# Patient Record
Sex: Female | Born: 1985 | Race: Black or African American | Hispanic: No | Marital: Single | Smoking: Current every day smoker
Health system: Southern US, Community
[De-identification: ages and names within clinical notes are randomized; demographics above are authoritative.]

## PROBLEM LIST (undated history)

## (undated) ENCOUNTER — Inpatient Hospital Stay (HOSPITAL_COMMUNITY): Payer: Self-pay

## (undated) DIAGNOSIS — B9689 Other specified bacterial agents as the cause of diseases classified elsewhere: Secondary | ICD-10-CM

## (undated) DIAGNOSIS — IMO0002 Reserved for concepts with insufficient information to code with codable children: Secondary | ICD-10-CM

## (undated) DIAGNOSIS — R87619 Unspecified abnormal cytological findings in specimens from cervix uteri: Secondary | ICD-10-CM

## (undated) DIAGNOSIS — N760 Acute vaginitis: Principal | ICD-10-CM

## (undated) DIAGNOSIS — A749 Chlamydial infection, unspecified: Secondary | ICD-10-CM

## (undated) DIAGNOSIS — A599 Trichomoniasis, unspecified: Secondary | ICD-10-CM

## (undated) HISTORY — PX: DILATION AND CURETTAGE OF UTERUS: SHX78

---
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
## 2006-01-09 ENCOUNTER — Inpatient Hospital Stay (HOSPITAL_COMMUNITY): Admission: AD | Admit: 2006-01-09 | Discharge: 2006-01-11 | Payer: Self-pay | Admitting: Family Medicine

## 2006-01-09 ENCOUNTER — Ambulatory Visit: Payer: Self-pay | Admitting: Family Medicine

## 2008-03-03 ENCOUNTER — Ambulatory Visit (HOSPITAL_COMMUNITY): Admission: RE | Admit: 2008-03-03 | Discharge: 2008-03-03 | Payer: Self-pay | Admitting: Obstetrics & Gynecology

## 2008-05-11 ENCOUNTER — Inpatient Hospital Stay (HOSPITAL_COMMUNITY): Admission: AD | Admit: 2008-05-11 | Discharge: 2008-05-13 | Payer: Self-pay | Admitting: Obstetrics & Gynecology

## 2008-05-11 ENCOUNTER — Ambulatory Visit: Payer: Self-pay | Admitting: Obstetrics and Gynecology

## 2009-08-02 ENCOUNTER — Emergency Department (HOSPITAL_COMMUNITY): Admission: EM | Admit: 2009-08-02 | Discharge: 2009-08-02 | Payer: Self-pay | Admitting: Family Medicine

## 2011-02-09 ENCOUNTER — Inpatient Hospital Stay (INDEPENDENT_AMBULATORY_CARE_PROVIDER_SITE_OTHER)
Admission: RE | Admit: 2011-02-09 | Discharge: 2011-02-09 | Disposition: A | Discharge status: Home / Self Care | Payer: Medicaid Other | Source: Ambulatory Visit | Attending: Emergency Medicine | Admitting: Emergency Medicine

## 2011-02-09 DIAGNOSIS — N760 Acute vaginitis: Secondary | ICD-10-CM

## 2011-02-09 LAB — POCT PREGNANCY, URINE: Preg Test, Ur: NEGATIVE

## 2011-02-09 LAB — POCT URINALYSIS DIPSTICK
Hgb urine dipstick: NEGATIVE
Nitrite: NEGATIVE
Protein, ur: NEGATIVE mg/dL
Urine Glucose, Fasting: NEGATIVE mg/dL
pH: 5.5 (ref 5.0–8.0)

## 2011-02-09 LAB — WET PREP, GENITAL

## 2011-02-10 LAB — GC/CHLAMYDIA PROBE AMP, GENITAL
Chlamydia, DNA Probe: NEGATIVE
GC Probe Amp, Genital: NEGATIVE

## 2011-03-19 LAB — HERPES SIMPLEX VIRUS CULTURE: Culture: DETECTED

## 2011-03-19 LAB — POCT URINALYSIS DIP (DEVICE)
Glucose, UA: NEGATIVE mg/dL
Hgb urine dipstick: NEGATIVE
Ketones, ur: 15 mg/dL — AB
Nitrite: NEGATIVE
Protein, ur: 30 mg/dL — AB
Specific Gravity, Urine: 1.025 (ref 1.005–1.030)
Urobilinogen, UA: 1 mg/dL (ref 0.0–1.0)
pH: 6 (ref 5.0–8.0)

## 2011-03-19 LAB — GC/CHLAMYDIA PROBE AMP, GENITAL
Chlamydia, DNA Probe: NEGATIVE
GC Probe Amp, Genital: NEGATIVE

## 2011-03-19 LAB — WET PREP, GENITAL
Trich, Wet Prep: NONE SEEN
Yeast Wet Prep HPF POC: NONE SEEN

## 2011-03-19 LAB — POCT PREGNANCY, URINE: Preg Test, Ur: NEGATIVE

## 2011-05-18 ENCOUNTER — Inpatient Hospital Stay (INDEPENDENT_AMBULATORY_CARE_PROVIDER_SITE_OTHER)
Admission: RE | Admit: 2011-05-18 | Discharge: 2011-05-18 | Disposition: A | Discharge status: Home / Self Care | Payer: Medicaid Other | Source: Ambulatory Visit | Attending: Emergency Medicine | Admitting: Emergency Medicine

## 2011-05-18 DIAGNOSIS — R6889 Other general symptoms and signs: Secondary | ICD-10-CM

## 2011-05-18 DIAGNOSIS — A6000 Herpesviral infection of urogenital system, unspecified: Secondary | ICD-10-CM

## 2011-05-18 LAB — WET PREP, GENITAL
Clue Cells Wet Prep HPF POC: NONE SEEN
Trich, Wet Prep: NONE SEEN
Yeast Wet Prep HPF POC: NONE SEEN

## 2011-05-19 LAB — GC/CHLAMYDIA PROBE AMP, GENITAL
Chlamydia, DNA Probe: NEGATIVE
GC Probe Amp, Genital: NEGATIVE

## 2011-09-07 LAB — CBC
HCT: 36.5
Hemoglobin: 12.6
MCHC: 34.5
MCV: 99.7
Platelets: 354
RBC: 3.66 — ABNORMAL LOW
RDW: 12.7
WBC: 7.4

## 2011-09-07 LAB — RPR: RPR Ser Ql: NONREACTIVE

## 2011-09-07 LAB — RAPID HIV SCREEN (WH-MAU): Rapid HIV Screen: NONREACTIVE

## 2011-11-01 ENCOUNTER — Emergency Department (HOSPITAL_COMMUNITY)
Admission: EM | Admit: 2011-11-01 | Discharge: 2011-11-01 | Disposition: A | Discharge status: Home / Self Care | Payer: Medicaid Other | Source: Home / Self Care | Attending: Family Medicine | Admitting: Family Medicine

## 2011-11-01 ENCOUNTER — Encounter: Payer: Self-pay | Admitting: *Deleted

## 2011-11-01 DIAGNOSIS — A499 Bacterial infection, unspecified: Secondary | ICD-10-CM

## 2011-11-01 DIAGNOSIS — N760 Acute vaginitis: Secondary | ICD-10-CM

## 2011-11-01 HISTORY — DX: Other specified bacterial agents as the cause of diseases classified elsewhere: B96.89

## 2011-11-01 HISTORY — DX: Other specified bacterial agents as the cause of diseases classified elsewhere: N76.0

## 2011-11-01 LAB — WET PREP, GENITAL

## 2011-11-01 LAB — POCT URINALYSIS DIP (DEVICE)
Bilirubin Urine: NEGATIVE
Glucose, UA: NEGATIVE mg/dL
Ketones, ur: NEGATIVE mg/dL
Leukocytes, UA: NEGATIVE
Nitrite: NEGATIVE
Protein, ur: NEGATIVE mg/dL
Specific Gravity, Urine: 1.025 (ref 1.005–1.030)
Urobilinogen, UA: 1 mg/dL (ref 0.0–1.0)
pH: 6 (ref 5.0–8.0)

## 2011-11-01 LAB — POCT PREGNANCY, URINE: Preg Test, Ur: NEGATIVE

## 2011-11-01 MED ORDER — METRONIDAZOLE 250 MG PO TABS: 250.0000 mg | ORAL_TABLET | Freq: Three times a day (TID) | ORAL | Status: AC

## 2011-11-01 NOTE — ED Notes (Signed)
Pt c/o milky white discharge onset approx 5-6 day ago.  States that she feels "uncomfortable" in her vaginal area.  Hx of genital herpes, but has had only 1 outbreak and doesn't know what to look for as far as symptoms.  Denies urinary sx.  Denies low abd pain.  Had unprotected sex 2 mos ago.

## 2011-11-01 NOTE — ED Provider Notes (Addendum)
History     CSN: [id number] Arrival date & time: 11/01/2011 10:17 AM   First MD Initiated Contact with Patient 11/01/11 1004      Chief Complaint  Patient presents with  . Vaginal Discharge    (Consider location/radiation/quality/duration/timing/severity/associated sxs/prior treatment) Patient is a 25 y.o. female presenting with vaginal discharge.  Vaginal Discharge This is a new problem. The current episode started more than 2 days ago. The problem occurs constantly. The problem has not changed since onset.Pertinent negatives include no abdominal pain.    Past Medical History  Diagnosis Date  . Genital herpes   . Bacterial vaginosis   . Ectopic pregnancy   . Abnormal Pap smear   . Chlamydia   . Trichomonas     Past Surgical History  Procedure Date  . Dilation and curettage of uterus   . Laparoscopy 01/29/2012    Procedure: LAPAROSCOPY OPERATIVE;  Surgeon: Hollie Salk C. Marice Potter, MD;  Location: [facility];  Service: Gynecology;  Laterality: N/A;  operative laparoscopy. Right salpingectomy with removal of ectopic pregnancy    Family History  Problem Relation Age of Onset  . Hypertension Maternal Grandmother   . Diabetes Maternal Grandmother   . Anesthesia problems Neg Hx   . Migraines Sister     History  Substance Use Topics  . Smoking status: Current Everyday Smoker -- 0.5 packs/day for 5 years    Types: Cigarettes  . Smokeless tobacco: Never Used  . Alcohol Use: No    OB History    Grav Para Term Preterm Abortions TAB SAB Ect Mult Living   5 3 3  0 2 1 0 1 0 3      Review of Systems  Constitutional: Negative.   Gastrointestinal: Negative for abdominal pain.  Genitourinary: Positive for vaginal discharge. Negative for vaginal bleeding, difficulty urinating and vaginal pain.    Allergies  Review of patient's allergies indicates no known allergies.  Home Medications   Current Outpatient Rx  Name Route Sig Dispense Refill  . ETONOGESTREL-ETHINYL ESTRADIOL  0.12-0.015 MG/24HR VA RING  Insert vaginally and leave in place for 3 consecutive weeks, then remove for 1 week. 1 each 12  . OXYCODONE-ACETAMINOPHEN 7.5-325 MG PO TABS Oral Take 1 tablet by mouth every 4 (four) hours as needed. For pain      BP 125/87  Pulse 70  Temp(Src) 98.8 F (37.1 C) (Oral)  Resp 12  SpO2 100%  LMP 10/02/2011  Physical Exam  Nursing note and vitals reviewed. Constitutional: She appears well-developed and well-nourished.  Abdominal: Soft. Bowel sounds are normal. She exhibits no distension and no mass. There is no tenderness. There is no rebound and no guarding.  Genitourinary: Uterus normal. Pelvic exam was performed with patient supine. There is no rash or tenderness on the right labia. There is no rash or tenderness on the left labia. Cervix exhibits no motion tenderness, no discharge and no friability. No erythema, tenderness or bleeding around the vagina. Vaginal discharge found.    ED Course  Procedures (including critical care time)  Labs Reviewed  POCT URINALYSIS DIP (DEVICE) - Abnormal; Notable for the following:    Hgb urine dipstick TRACE (*)    All other components within normal limits  WET PREP, GENITAL - Abnormal; Notable for the following:    Yeast Wet Prep HPF POC FEW (*)    WBC, Wet Prep HPF POC FEW (*)    All other components within normal limits  POCT PREGNANCY, URINE  GC/CHLAMYDIA PROBE AMP, GENITAL  LAB REPORT - SCANNED   No results found.   1. Bacterial vaginosis       MDM          Barkley Bruns, MD 11/01/11 1055  Linna Hoff, MD 04/07/12 1007

## 2011-11-02 ENCOUNTER — Telehealth (HOSPITAL_COMMUNITY): Payer: Self-pay | Admitting: *Deleted

## 2011-11-02 LAB — GC/CHLAMYDIA PROBE AMP, GENITAL
Chlamydia, DNA Probe: NEGATIVE
GC Probe Amp, Genital: NEGATIVE

## 2011-11-04 ENCOUNTER — Telehealth (HOSPITAL_COMMUNITY): Payer: Self-pay | Admitting: *Deleted

## 2011-11-04 NOTE — ED Notes (Signed)
Pt notified of need for treatment.  Rx for Diflucan 150 mg 1 po x 1 today and may repeat in 2 days if needed (#2) called into [facility] on [address] per request of pt.  Rx ordered by Dr. Ladon Applebaum on 11/02/11

## 2011-12-01 ENCOUNTER — Encounter (HOSPITAL_COMMUNITY): Payer: Self-pay | Admitting: *Deleted

## 2011-12-01 ENCOUNTER — Inpatient Hospital Stay (HOSPITAL_COMMUNITY)
Admission: AD | Admit: 2011-12-01 | Discharge: 2011-12-01 | Disposition: A | Discharge status: Home / Self Care | Payer: Medicaid Other | Source: Ambulatory Visit | Attending: Obstetrics & Gynecology | Admitting: Obstetrics & Gynecology

## 2011-12-01 DIAGNOSIS — N899 Noninflammatory disorder of vagina, unspecified: Secondary | ICD-10-CM | POA: Insufficient documentation

## 2011-12-01 DIAGNOSIS — N760 Acute vaginitis: Secondary | ICD-10-CM

## 2011-12-01 LAB — POCT PREGNANCY, URINE: Preg Test, Ur: NEGATIVE

## 2011-12-01 LAB — WET PREP, GENITAL: Clue Cells Wet Prep HPF POC: NONE SEEN

## 2011-12-01 MED ORDER — FLUCONAZOLE 150 MG PO TABS: 150.0000 mg | ORAL_TABLET | Freq: Once | ORAL | Status: AC

## 2011-12-01 NOTE — ED Provider Notes (Signed)
History   Pt presents today c/o vag irritation for the past 1wk. She states the itching has been both internal and external. She denies abd pain or fever. She states her menses began this morning.  Chief Complaint  Patient presents with  . Vaginal Discharge   HPI  OB History    Grav Para Term Preterm Abortions TAB SAB Ect Mult Living   4 3 3  0 1 1 0 0 0 3      Past Medical History  Diagnosis Date  . Genital herpes   . Bacterial vaginosis     Past Surgical History  Procedure Date  . Dilation and curettage of uterus     Family History  Problem Relation Age of Onset  . Hypertension Maternal Grandmother   . Diabetes Maternal Grandmother     History  Substance Use Topics  . Smoking status: Current Everyday Smoker -- 1.0 packs/day  . Smokeless tobacco: Not on file  . Alcohol Use: No    Allergies: No Known Allergies  No prescriptions prior to admission    Review of Systems  Constitutional: Negative for fever.  Eyes: Negative for blurred vision.  Cardiovascular: Negative for chest pain.  Gastrointestinal: Negative for nausea, vomiting, abdominal pain, diarrhea and constipation.  Genitourinary: Negative for dysuria, urgency, frequency and hematuria.  Neurological: Negative for dizziness and headaches.  Psychiatric/Behavioral: Negative for depression and suicidal ideas.   Physical Exam   Blood pressure 117/72, pulse 90, temperature 98.8 F (37.1 C), temperature source Oral, resp. rate 20, height 5\' 5"  (1.651 m), weight 194 lb 6.4 oz (88.179 kg), last menstrual period 12/01/2011, SpO2 99.00%.  Physical Exam  Nursing note and vitals reviewed. Constitutional: She is oriented to person, place, and time. She appears well-developed and well-nourished. No distress.  HENT:  Head: Normocephalic and atraumatic.  Eyes: EOM are normal. Pupils are equal, round, and reactive to light.  GI: Soft. She exhibits no distension. There is no tenderness. There is no rebound and no  guarding.  Genitourinary: There is no rash, tenderness, lesion or injury on the right labia. There is no rash, tenderness, lesion or injury on the left labia. There is bleeding around the vagina. Vaginal discharge found.       Moderate amount of bleeding noted on exam.  Neurological: She is alert and oriented to person, place, and time.  Skin: Skin is warm and dry. She is not diaphoretic.  Psychiatric: She has a normal mood and affect. Her behavior is normal. Judgment and thought content normal.    MAU Course  Procedures  Wet prep and GC/Chlamydia cultures done.  Results for orders placed during the hospital encounter of 12/01/11 (from the past 24 hour(s))  POCT PREGNANCY, URINE     Status: Normal   Collection Time   12/01/11  9:42 AM      Component Value Range   Preg Test, Ur NEGATIVE    WET PREP, GENITAL     Status: Abnormal   Collection Time   12/01/11 10:07 AM      Component Value Range   Yeast, Wet Prep NONE SEEN  NONE SEEN    Trich, Wet Prep NONE SEEN  NONE SEEN    Clue Cells, Wet Prep NONE SEEN  NONE SEEN    WBC, Wet Prep HPF POC FEW (*) NONE SEEN      Assessment and Plan  Vag irritation: will prophylactically tx with diflucan. Discussed diet, activity, risks, and precautions. Will await cultures.  Clinton Gallant. Rice  III, DrHSc, MPAS, PA-C  12/01/2011, 10:12 AM   Henrietta Hoover, PA 12/01/11 1040

## 2011-12-01 NOTE — Progress Notes (Signed)
Pt in c/o vaginal itching internal and external x1 week.  Denies any pain or foul odor.  Reports a milky, thick discharge.  Menstrual cycle started today.

## 2011-12-01 NOTE — Progress Notes (Signed)
Vaginal itching started a wk ago.

## 2011-12-02 LAB — GC/CHLAMYDIA PROBE AMP, GENITAL: Chlamydia, DNA Probe: NEGATIVE

## 2011-12-06 NOTE — ED Provider Notes (Signed)
Agree with above note.  Jodi Estrada H. 12/06/2011 2:12 AM

## 2012-01-29 ENCOUNTER — Inpatient Hospital Stay (HOSPITAL_COMMUNITY): Payer: Medicaid Other

## 2012-01-29 ENCOUNTER — Encounter (HOSPITAL_COMMUNITY): Payer: Self-pay | Admitting: Anesthesiology

## 2012-01-29 ENCOUNTER — Other Ambulatory Visit: Payer: Self-pay | Admitting: Obstetrics & Gynecology

## 2012-01-29 ENCOUNTER — Encounter (HOSPITAL_COMMUNITY)
Admission: AD | Disposition: A | Discharge status: Home / Self Care | Payer: Self-pay | Source: Ambulatory Visit | Attending: Obstetrics & Gynecology

## 2012-01-29 ENCOUNTER — Encounter (HOSPITAL_COMMUNITY): Payer: Self-pay | Admitting: *Deleted

## 2012-01-29 ENCOUNTER — Inpatient Hospital Stay (HOSPITAL_COMMUNITY): Payer: Medicaid Other | Admitting: Anesthesiology

## 2012-01-29 ENCOUNTER — Ambulatory Visit (HOSPITAL_COMMUNITY)
Admission: AD | Admit: 2012-01-29 | Discharge: 2012-01-29 | Disposition: A | Discharge status: Home / Self Care | Payer: Medicaid Other | Source: Ambulatory Visit | Attending: Obstetrics & Gynecology | Admitting: Obstetrics & Gynecology

## 2012-01-29 DIAGNOSIS — O00109 Unspecified tubal pregnancy without intrauterine pregnancy: Principal | ICD-10-CM | POA: Insufficient documentation

## 2012-01-29 HISTORY — PX: LAPAROSCOPY: SHX197

## 2012-01-29 LAB — URINALYSIS, ROUTINE W REFLEX MICROSCOPIC
Bilirubin Urine: NEGATIVE
Hgb urine dipstick: NEGATIVE
Ketones, ur: NEGATIVE mg/dL
Nitrite: NEGATIVE
pH: 6.5 (ref 5.0–8.0)

## 2012-01-29 LAB — CBC
HCT: 39 % (ref 36.0–46.0)
Hemoglobin: 13.2 g/dL (ref 12.0–15.0)
MCHC: 33.8 g/dL (ref 30.0–36.0)
RBC: 4.04 MIL/uL (ref 3.87–5.11)

## 2012-01-29 LAB — HCG, QUANTITATIVE, PREGNANCY: hCG, Beta Chain, Quant, S: 17799 m[IU]/mL — ABNORMAL HIGH (ref <5)

## 2012-01-29 SURGERY — LAPAROSCOPY OPERATIVE
Anesthesia: Epidural | Site: Abdomen | Wound class: Clean Contaminated

## 2012-01-29 MED ORDER — KETOROLAC TROMETHAMINE 30 MG/ML IJ SOLN
INTRAMUSCULAR | Status: DC | PRN
  Administered 2012-01-29: 30 mg via INTRAMUSCULAR

## 2012-01-29 MED ORDER — PHENYLEPHRINE 40 MCG/ML (10ML) SYRINGE FOR IV PUSH (FOR BLOOD PRESSURE SUPPORT)
PREFILLED_SYRINGE | INTRAVENOUS | Status: AC
  Filled 2012-01-29: qty 5

## 2012-01-29 MED ORDER — PHENYLEPHRINE HCL 10 MG/ML IJ SOLN
INTRAMUSCULAR | Status: DC | PRN
  Administered 2012-01-29: .08 mg via INTRAVENOUS
  Administered 2012-01-29: .12 mg via INTRAVENOUS

## 2012-01-29 MED ORDER — BUPIVACAINE HCL (PF) 0.5 % IJ SOLN
INTRAMUSCULAR | Status: AC
  Filled 2012-01-29: qty 30

## 2012-01-29 MED ORDER — LACTATED RINGERS IV SOLN
INTRAVENOUS | Status: DC | PRN
  Administered 2012-01-29 (×2): via INTRAVENOUS

## 2012-01-29 MED ORDER — FENTANYL CITRATE 0.05 MG/ML IJ SOLN
INTRAMUSCULAR | Status: AC
  Filled 2012-01-29: qty 5

## 2012-01-29 MED ORDER — MIDAZOLAM HCL 2 MG/2ML IJ SOLN
INTRAMUSCULAR | Status: AC
  Filled 2012-01-29: qty 2

## 2012-01-29 MED ORDER — LIDOCAINE HCL (CARDIAC) 20 MG/ML IV SOLN
INTRAVENOUS | Status: DC | PRN
  Administered 2012-01-29: 40 mg via INTRAVENOUS

## 2012-01-29 MED ORDER — CITRIC ACID-SODIUM CITRATE 334-500 MG/5ML PO SOLN
ORAL | Status: AC
  Administered 2012-01-29: 30 mL via ORAL
  Filled 2012-01-29: qty 15

## 2012-01-29 MED ORDER — KETOROLAC TROMETHAMINE 30 MG/ML IJ SOLN
INTRAMUSCULAR | Status: DC | PRN
  Administered 2012-01-29: 30 mg via INTRAVENOUS

## 2012-01-29 MED ORDER — NEOSTIGMINE METHYLSULFATE 1 MG/ML IJ SOLN
INTRAMUSCULAR | Status: AC
  Filled 2012-01-29: qty 10

## 2012-01-29 MED ORDER — BUPIVACAINE HCL (PF) 0.5 % IJ SOLN
INTRAMUSCULAR | Status: DC | PRN
  Administered 2012-01-29: 10 mL

## 2012-01-29 MED ORDER — ONDANSETRON HCL 4 MG/2ML IJ SOLN
INTRAMUSCULAR | Status: AC
  Filled 2012-01-29: qty 2

## 2012-01-29 MED ORDER — KETOROLAC TROMETHAMINE 30 MG/ML IJ SOLN
INTRAMUSCULAR | Status: AC
  Filled 2012-01-29: qty 2

## 2012-01-29 MED ORDER — NEOSTIGMINE METHYLSULFATE 1 MG/ML IJ SOLN
INTRAMUSCULAR | Status: DC | PRN
  Administered 2012-01-29: 3 mg via INTRAVENOUS

## 2012-01-29 MED ORDER — MIDAZOLAM HCL 5 MG/5ML IJ SOLN
INTRAMUSCULAR | Status: DC | PRN
  Administered 2012-01-29: 2 mg via INTRAVENOUS

## 2012-01-29 MED ORDER — DEXAMETHASONE SODIUM PHOSPHATE 10 MG/ML IJ SOLN
INTRAMUSCULAR | Status: AC
  Filled 2012-01-29: qty 1

## 2012-01-29 MED ORDER — CEFAZOLIN SODIUM 1-5 GM-% IV SOLN
INTRAVENOUS | Status: DC | PRN
  Administered 2012-01-29: 1 g via INTRAVENOUS

## 2012-01-29 MED ORDER — FENTANYL CITRATE 0.05 MG/ML IJ SOLN
INTRAMUSCULAR | Status: DC | PRN
  Administered 2012-01-29: 50 ug via INTRAVENOUS
  Administered 2012-01-29 (×2): 100 ug via INTRAVENOUS
  Administered 2012-01-29: 50 ug via INTRAVENOUS
  Administered 2012-01-29: 150 ug via INTRAVENOUS
  Administered 2012-01-29: 50 ug via INTRAVENOUS

## 2012-01-29 MED ORDER — DEXTROSE IN LACTATED RINGERS 5 % IV SOLN
INTRAVENOUS | Status: DC
  Administered 2012-01-29: 16:00:00 via INTRAVENOUS
  Administered 2012-01-29 (×2): 125 mL/h via INTRAVENOUS

## 2012-01-29 MED ORDER — LIDOCAINE HCL (CARDIAC) 20 MG/ML IV SOLN
INTRAVENOUS | Status: AC
  Filled 2012-01-29: qty 5

## 2012-01-29 MED ORDER — GLYCOPYRROLATE 0.2 MG/ML IJ SOLN
INTRAMUSCULAR | Status: AC
  Filled 2012-01-29: qty 1

## 2012-01-29 MED ORDER — ROCURONIUM BROMIDE 50 MG/5ML IV SOLN
INTRAVENOUS | Status: AC
  Filled 2012-01-29: qty 1

## 2012-01-29 MED ORDER — CITRIC ACID-SODIUM CITRATE 334-500 MG/5ML PO SOLN
30.0000 mL | Freq: Once | ORAL | Status: AC
  Administered 2012-01-29: 30 mL via ORAL

## 2012-01-29 MED ORDER — CEFAZOLIN SODIUM 1-5 GM-% IV SOLN
1.0000 g | INTRAVENOUS | Status: AC
  Administered 2012-01-29: 1 g via INTRAVENOUS

## 2012-01-29 MED ORDER — ONDANSETRON HCL 4 MG/2ML IJ SOLN
INTRAMUSCULAR | Status: DC | PRN
  Administered 2012-01-29: 4 mg via INTRAVENOUS

## 2012-01-29 MED ORDER — LACTATED RINGERS IR SOLN
Status: DC | PRN
  Administered 2012-01-29: 3000 mL

## 2012-01-29 MED ORDER — PROPOFOL 10 MG/ML IV EMUL
INTRAVENOUS | Status: AC
  Filled 2012-01-29: qty 20

## 2012-01-29 MED ORDER — DEXAMETHASONE SODIUM PHOSPHATE 4 MG/ML IJ SOLN
INTRAMUSCULAR | Status: DC | PRN
  Administered 2012-01-29: 10 mg via INTRAVENOUS

## 2012-01-29 MED ORDER — SUCCINYLCHOLINE CHLORIDE 20 MG/ML IJ SOLN
INTRAMUSCULAR | Status: AC
  Filled 2012-01-29: qty 10

## 2012-01-29 MED ORDER — ROCURONIUM BROMIDE 100 MG/10ML IV SOLN
INTRAVENOUS | Status: DC | PRN
  Administered 2012-01-29: 2.5 mg via INTRAVENOUS
  Administered 2012-01-29: 30 mg via INTRAVENOUS

## 2012-01-29 MED ORDER — FENTANYL CITRATE 0.05 MG/ML IJ SOLN: 25.0000 ug | INTRAMUSCULAR | Status: DC | PRN

## 2012-01-29 MED ORDER — CEFAZOLIN SODIUM 1-5 GM-% IV SOLN
INTRAVENOUS | Status: AC
  Administered 2012-01-29: 1 g via INTRAVENOUS
  Filled 2012-01-29: qty 50

## 2012-01-29 MED ORDER — OXYCODONE-ACETAMINOPHEN 7.5-325 MG PO TABS: 1.0000 | ORAL_TABLET | ORAL | Status: DC | PRN

## 2012-01-29 MED ORDER — PROPOFOL 10 MG/ML IV EMUL
INTRAVENOUS | Status: DC | PRN
  Administered 2012-01-29: 160 mg via INTRAVENOUS

## 2012-01-29 MED ORDER — SUCCINYLCHOLINE CHLORIDE 20 MG/ML IJ SOLN
INTRAMUSCULAR | Status: DC | PRN
  Administered 2012-01-29: 120 mg via INTRAVENOUS

## 2012-01-29 MED ORDER — GLYCOPYRROLATE 0.2 MG/ML IJ SOLN
INTRAMUSCULAR | Status: DC | PRN
  Administered 2012-01-29: .6 mg via INTRAVENOUS

## 2012-01-29 SURGICAL SUPPLY — 34 items
APL SKNCLS STERI-STRIP NONHPOA (GAUZE/BANDAGES/DRESSINGS) ×1
APPLICATOR COTTON TIP 6IN STRL (MISCELLANEOUS) ×2 IMPLANT
BAG SPEC RTRVL LRG 6X4 10 (ENDOMECHANICALS)
BENZOIN TINCTURE PRP APPL 2/3 (GAUZE/BANDAGES/DRESSINGS) ×2 IMPLANT
CABLE HIGH FREQUENCY MONO STRZ (ELECTRODE) IMPLANT
CATH ROBINSON RED A/P 16FR (CATHETERS) ×1 IMPLANT
CHLORAPREP W/TINT 26ML (MISCELLANEOUS) ×2 IMPLANT
CLOTH BEACON ORANGE TIMEOUT ST (SAFETY) ×2 IMPLANT
DRSG COVADERM PLUS 2X2 (GAUZE/BANDAGES/DRESSINGS) ×1 IMPLANT
ELECT REM PT RETURN 9FT ADLT (ELECTROSURGICAL) ×2
ELECTRODE REM PT RTRN 9FT ADLT (ELECTROSURGICAL) IMPLANT
FORCEPS CUTTING 33CM 5MM (CUTTING FORCEPS) IMPLANT
FORCEPS CUTTING 45CM 5MM (CUTTING FORCEPS) IMPLANT
GLOVE BIO SURGEON STRL SZ 6.5 (GLOVE) ×4 IMPLANT
GOWN PREVENTION PLUS LG XLONG (DISPOSABLE) ×4 IMPLANT
NDL INSUFFLATION 14GA 120MM (NEEDLE) ×1 IMPLANT
NDL SAFETY ECLIPSE 18X1.5 (NEEDLE) ×1 IMPLANT
NEEDLE HYPO 18GX1.5 SHARP (NEEDLE) ×2
NEEDLE INSUFFLATION 14GA 120MM (NEEDLE) ×2 IMPLANT
NS IRRIG 1000ML POUR BTL (IV SOLUTION) ×1 IMPLANT
PACK LAPAROSCOPY BASIN (CUSTOM PROCEDURE TRAY) ×2 IMPLANT
POUCH SPECIMEN RETRIEVAL 10MM (ENDOMECHANICALS) IMPLANT
PROTECTOR NERVE ULNAR (MISCELLANEOUS) ×2 IMPLANT
SET IRRIG TUBING LAPAROSCOPIC (IRRIGATION / IRRIGATOR) ×1 IMPLANT
STERI STRIP BROWN 1/4X3 R155 (GAUZE/BANDAGES/DRESSINGS) ×1 IMPLANT
STRIP CLOSURE SKIN 1/4X4 (GAUZE/BANDAGES/DRESSINGS) ×2 IMPLANT
SUT VICRYL 0 ENDOLOOP (SUTURE) IMPLANT
SUT VICRYL 0 UR6 27IN ABS (SUTURE) ×3 IMPLANT
SUT VICRYL 4-0 PS2 18IN ABS (SUTURE) ×3 IMPLANT
TOWEL OR 17X24 6PK STRL BLUE (TOWEL DISPOSABLE) ×4 IMPLANT
TROCAR XCEL NON-BLD 11X100MML (ENDOMECHANICALS) ×2 IMPLANT
TROCAR XCEL NON-BLD 5MMX100MML (ENDOMECHANICALS) ×1 IMPLANT
WARMER LAPAROSCOPE (MISCELLANEOUS) ×2 IMPLANT
WATER STERILE IRR 1000ML POUR (IV SOLUTION) ×1 IMPLANT

## 2012-01-29 NOTE — Anesthesia Procedure Notes (Signed)
Procedure Name: Intubation Performed by: Sallye Lunz D Pre-anesthesia Checklist: Patient identified, Emergency Drugs available, Suction available, Timeout performed and Patient being monitored Patient Re-evaluated:Patient Re-evaluated prior to inductionOxygen Delivery Method: Circle System Utilized Preoxygenation: Pre-oxygenation with 100% oxygen Intubation Type: IV induction, Rapid sequence and Cricoid Pressure applied Laryngoscope Size: Mac and 3 Grade View: Grade I Tube type: Oral Tube size: 7.0 mm Number of attempts: 1 Airway Equipment and Method: stylet Placement Confirmation: ETT inserted through vocal cords under direct vision,  positive ETCO2 and breath sounds checked- equal and bilateral Secured at: 22 cm Tube secured with: Tape Dental Injury: Teeth and Oropharynx as per pre-operative assessment

## 2012-01-29 NOTE — ED Provider Notes (Signed)
History   Jodi Estrada is a 26 y.o. year old G51P3013 female at Unknown weeks gestation who presents to MAU reporting low abd pain 6/10. No BM since six days ago. Patient's last menstrual period was 12/02/2011. Took Plan B 12/13/11. Had spotting x 12 days since 01/05/12 w/out passage of tissue. Last ate at 2100 01/28/12.    CSN: [id number]  Arrival date & time 01/29/12  1313   None     Chief Complaint  Patient presents with  . Abdominal Pain  . Constipation    (Consider location/radiation/quality/duration/timing/severity/associated sxs/prior treatment) HPI  Past Medical History  Diagnosis Date  . Genital herpes   . Bacterial vaginosis     Past Surgical History  Procedure Date  . Dilation and curettage of uterus     Family History  Problem Relation Age of Onset  . Hypertension Maternal Grandmother   . Diabetes Maternal Grandmother     History  Substance Use Topics  . Smoking status: Current Everyday Smoker -- 1.0 packs/day  . Smokeless tobacco: Not on file  . Alcohol Use: No    OB History    Grav Para Term Preterm Abortions TAB SAB Ect Mult Living   4 3 3  0 1 1 0 0 0 3      Review of Systems  Constitutional: Negative for fever, chills and appetite change.  Gastrointestinal: Positive for abdominal pain, constipation and rectal pain. Negative for nausea, vomiting, diarrhea and abdominal distention.       Feels as if she has need to make herself vomit twice to ease the discomfort.  Genitourinary: Negative for dysuria, frequency, flank pain, vaginal bleeding and pelvic pain.  Neurological: Negative for weakness.    Allergies  Review of patient's allergies indicates no known allergies.  Home Medications  No current outpatient prescriptions on file.  BP 121/74  Pulse 96  Temp(Src) 97.7 F (36.5 C) (Oral)  Ht 5\' 7"  (1.702 m)  Wt 89.721 kg (197 lb 12.8 oz)  BMI 30.98 kg/m2  LMP 12/02/2011  Physical Exam  Constitutional: She appears well-developed and  well-nourished. She appears distressed (mildly).  HENT:  Head: Normocephalic.  Eyes: Pupils are equal, round, and reactive to light.  Cardiovascular: Normal rate.   Pulmonary/Chest: Effort normal.  Abdominal: She exhibits distension (mild). She exhibits no mass. There is tenderness (R>L). There is guarding. There is no rebound and no CVA tenderness.       Decreased BS    ED Course  Procedures (including critical care time) Results for orders placed during the hospital encounter of 01/29/12 (from the past 24 hour(s))  URINALYSIS, ROUTINE W REFLEX MICROSCOPIC     Status: Normal   Collection Time   01/29/12  1:25 PM      Component Value Range   Color, Urine YELLOW  YELLOW    APPearance CLEAR  CLEAR    Specific Gravity, Urine 1.010  1.005 - 1.030    pH 6.5  5.0 - 8.0    Glucose, UA NEGATIVE  NEGATIVE (mg/dL)   Hgb urine dipstick NEGATIVE  NEGATIVE    Bilirubin Urine NEGATIVE  NEGATIVE    Ketones, ur NEGATIVE  NEGATIVE (mg/dL)   Protein, ur NEGATIVE  NEGATIVE (mg/dL)   Urobilinogen, UA 0.2  0.0 - 1.0 (mg/dL)   Nitrite NEGATIVE  NEGATIVE    Leukocytes, UA NEGATIVE  NEGATIVE   POCT PREGNANCY, URINE     Status: Abnormal   Collection Time   01/29/12  1:37 PM  Component Value Range   Preg Test, Ur POSITIVE (*) NEGATIVE   ABO/RH     Status: Normal   Collection Time   01/29/12  1:53 PM      Component Value Range   ABO/RH(D) A POS    HCG, QUANTITATIVE, PREGNANCY     Status: Abnormal   Collection Time   01/29/12  1:53 PM      Component Value Range   hCG, Beta Chain, Quant, S 17799 (*) <5 (mIU/mL)  CBC     Status: Normal   Collection Time   01/29/12  2:04 PM      Component Value Range   WBC 5.6  4.0 - 10.5 (K/uL)   RBC 4.04  3.87 - 5.11 (MIL/uL)   Hemoglobin 13.2  12.0 - 15.0 (g/dL)   HCT 96.0  45.4 - 09.8 (%)   MCV 96.5  78.0 - 100.0 (fL)   MCH 32.7  26.0 - 34.0 (pg)   MCHC 33.8  30.0 - 36.0 (g/dL)   RDW 11.9  14.7 - 82.9 (%)   Platelets 290  150 - 400 (K/uL)   US shows  ruptured right ectopic. 8.2x2.8x2.0 cm heterogeneous clot.  MDM  Assessment: Ruptured right ectopic  Plan: Prep for OR per consult w/ Dr. Marice Potter.  Dorathy Kinsman 01/29/2012 3:23 PM

## 2012-01-29 NOTE — Anesthesia Preprocedure Evaluation (Addendum)
Anesthesia Evaluation  Patient identified by MRN, date of birth, ID band Patient awake    Reviewed: Allergy & Precautions, H&P , Patient's Chart, lab work & pertinent test results, reviewed documented beta blocker date and time   Airway Mallampati: II TM Distance: >3 FB Neck ROM: full    Dental No notable dental hx.    Pulmonary  clear to auscultation  Pulmonary exam normal       Cardiovascular regular Normal    Neuro/Psych    GI/Hepatic   Endo/Other    Renal/GU      Musculoskeletal   Abdominal   Peds  Hematology   Anesthesia Other Findings   Reproductive/Obstetrics                           Anesthesia Physical Anesthesia Plan  ASA: II and Emergent  Anesthesia Plan: Epidural   Post-op Pain Management:    Induction: Intravenous  Airway Management Planned: Oral ETT  Additional Equipment:   Intra-op Plan:   Post-operative Plan:   Informed Consent: I have reviewed the patients History and Physical, chart, labs and discussed the procedure including the risks, benefits and alternatives for the proposed anesthesia with the patient or authorized representative who has indicated his/her understanding and acceptance.   Dental Advisory Given and Dental advisory given  Plan Discussed with: CRNA and Surgeon  Anesthesia Plan Comments: (  Discussed  general anesthesia, including possible nausea, instrumentation of airway, sore throat,pulmonary aspiration, etc. I asked if the were any outstanding questions, or  concerns before we proceeded. )        Anesthesia Quick Evaluation

## 2012-01-29 NOTE — Op Note (Signed)
01/29/2012  5:12 PM  PATIENT:  Jodi Estrada  26 y.o. female  PRE-OPERATIVE DIAGNOSIS:  ruptured ectopic pregnancy  POST-OPERATIVE DIAGNOSIS:  ruptured ectopic pregnancy  PROCEDURE:  Procedure(s) (LRB): LAPAROSCOPY OPERATIVE (N/A) Right salpinjectomy  SURGEON:  Surgeon(s) and Role:    * Natally Ribera C. Marice Potter, MD - Primary  PHYSICIAN ASSISTANT:   ASSISTANTS: none   ANESTHESIA:   general  EBL:  Total I/O In: 800 [I.V.:800] Out: 10 [Blood:10]  BLOOD ADMINISTERED:none  DRAINS: none   LOCAL MEDICATIONS USED:  MARCAINE     SPECIMEN:  Source of Specimen:  right oviduct  DISPOSITION OF SPECIMEN:  PATHOLOGY  COUNTS:  YES  TOURNIQUET:  * No tourniquets in log *  DICTATION: .Dragon Dictation  PLAN OF CARE: Discharge to home after PACU  PATIENT DISPOSITION:  PACU - hemodynamically stable.   Delay start of Pharmacological VTE agent (>24hrs) due to surgical blood loss or risk of bleeding: not applicable  The risks, benefits, and alternatives of surgery were explained, understood, accepted. All questions were answered and she signed her consents. In the operating room she was given general anesthesia and placed in the dorsolithotomy position. A timeout was done. Her abdomen and vagina were prepped and draped in the usual sterile fashion. A bimanual exam revealed a normal size and shape uterus and no pelvic masses. A Foley catheter was placed and and it drained clear throughout this case. Hulka manipulator was placed on the cervix. Gloves were changed and attention was turned to her abdomen. Approximately 5 mL the 0.5% Marcaine was used to inject the umbilical area. I then made an incision and a 10 mm Excel trocar was placed. Laparoscopy confirmed correct placement. She was placed in Trendelenburg position. Her pelvis was inspected. There was a proximally 1 L of clotted blood in her pelvis. Her upper abdomen appeared normal. The right oviduct was particularly large and swollen and oozing  dark blood. Both ovaries appeared normal, as did her left oviduct. A 5 mm port was placed in the right lower quadrant under direct laparoscopic visualization after injecting approximately 5 mL of 0.5% Marcaine. A 10 mm port was placed in the left lower quadrant under direct laparoscopic visualization, taking care to avoid the inferior epigastric vessels. There is also  Marcaine injected prior to the incision. I used the gyrus to excise the ruptured right oviduct. Excellent hemostasis was noted. I the used a 10 mm suction irrigator to clean out all the old brought out of her pelvis. I took her out of Trendelenburg position so as to allow the blood from the upper abdomen to come down to the bottom of her pelvis. I used the Endopouch to remove the right oviduct. I then did a subcuticular closure at all 3 incision sites with a 3-0 Vicryl suture. Steri-Strips were placed. The Hulka manipulator was removed. She was extubated and taken to recovery in stable condition. She tolerated the procedure well.

## 2012-01-29 NOTE — Progress Notes (Signed)
Onset of sharp abdominal pains no BM since last Monday after taking a laxative, difficulty passing gas, LMP 12/02/11 regular period, took emergency contraceptive on 12/13/11 has had some spotting for about 12 days.

## 2012-01-29 NOTE — Anesthesia Postprocedure Evaluation (Signed)
Anesthesia Post Note  Patient: Jodi Estrada  Procedure(s) Performed: Procedure(s) (LRB): LAPAROSCOPY OPERATIVE (N/A)  Anesthesia type: GA  Patient location: PACU  Post pain: Pain level controlled  Post assessment: Post-op Vital signs reviewed  Last Vitals:  Filed Vitals:   01/29/12 1830  BP: 109/68  Pulse: 84  Temp: 36.3 C  Resp: 20    Post vital signs: Reviewed  Level of consciousness: sedated  Complications: No apparent anesthesia complications

## 2012-01-29 NOTE — ED Notes (Signed)
Dr. Marice Potter at bedside  Discussed u/s results and reason for surgery. Pt tearfull and scared about surgery . Talked with her for a while and she felt  better about surgery. Pt able to talk to family on the phone as well.

## 2012-01-29 NOTE — Transfer of Care (Signed)
Immediate Anesthesia Transfer of Care Note  Patient: Jodi Estrada  Procedure(s) Performed: Procedure(s) (LRB): LAPAROSCOPY OPERATIVE (N/A)  Patient Location: PACU  Anesthesia Type: General  Level of Consciousness: awake, alert  and oriented  Airway & Oxygen Therapy: Patient Spontanous Breathing and Patient connected to nasal cannula oxygen  Post-op Assessment: Report given to PACU RN and Post -op Vital signs reviewed and stable  Post vital signs: stable  Complications: No apparent anesthesia complications

## 2012-01-29 NOTE — H&P (Signed)
26 yo AA G5P3 A1 who came to the MAU with complaint of 6 days of abdominal pain, which she associated with constipation.  Upon arrival here, her [facility] is 17,000. Her HBG is 13.2. Her abdominal exam reveals signigicant tenderness. Ultrasound shows an 8 cm clot consistent with ruptured ectopic pregnancy. Of note, she took the Plan B 12-13-11 and had some spotting for the last 12 days.  I have notified the OR and anesthesia. I have personally explained the surgery and its risks and answered all questions.

## 2012-01-30 ENCOUNTER — Encounter (HOSPITAL_COMMUNITY): Payer: Self-pay | Admitting: Obstetrics & Gynecology

## 2012-02-09 ENCOUNTER — Inpatient Hospital Stay (HOSPITAL_COMMUNITY)
Admission: AD | Admit: 2012-02-09 | Discharge: 2012-02-09 | Disposition: A | Discharge status: Home / Self Care | Payer: Medicaid Other | Source: Ambulatory Visit | Attending: Obstetrics & Gynecology | Admitting: Obstetrics & Gynecology

## 2012-02-09 ENCOUNTER — Encounter (HOSPITAL_COMMUNITY): Payer: Self-pay

## 2012-02-09 DIAGNOSIS — B373 Candidiasis of vulva and vagina: Secondary | ICD-10-CM | POA: Insufficient documentation

## 2012-02-09 DIAGNOSIS — B379 Candidiasis, unspecified: Secondary | ICD-10-CM

## 2012-02-09 DIAGNOSIS — A5901 Trichomonal vulvovaginitis: Secondary | ICD-10-CM | POA: Insufficient documentation

## 2012-02-09 DIAGNOSIS — L293 Anogenital pruritus, unspecified: Secondary | ICD-10-CM | POA: Insufficient documentation

## 2012-02-09 DIAGNOSIS — B3731 Acute candidiasis of vulva and vagina: Secondary | ICD-10-CM | POA: Insufficient documentation

## 2012-02-09 HISTORY — DX: Unspecified abnormal cytological findings in specimens from cervix uteri: R87.619

## 2012-02-09 HISTORY — DX: Reserved for concepts with insufficient information to code with codable children: IMO0002

## 2012-02-09 HISTORY — DX: Chlamydial infection, unspecified: A74.9

## 2012-02-09 HISTORY — DX: Trichomoniasis, unspecified: A59.9

## 2012-02-09 LAB — WET PREP, GENITAL

## 2012-02-09 MED ORDER — METRONIDAZOLE 500 MG PO TABS: 500.0000 mg | ORAL_TABLET | Freq: Two times a day (BID) | ORAL | Status: AC

## 2012-02-09 MED ORDER — FLUCONAZOLE 150 MG PO TABS
150.0000 mg | ORAL_TABLET | Freq: Once | ORAL | Status: AC
  Administered 2012-02-09: 150 mg via ORAL
  Filled 2012-02-09: qty 1

## 2012-02-09 NOTE — Progress Notes (Signed)
Called and was told to come in.  Ruptured ecptopic on 02/17. Vag itching started yesterday.

## 2012-02-09 NOTE — ED Provider Notes (Signed)
History     CSN: [id number]  Arrival date & time 02/09/12  1215   None     Chief Complaint  Patient presents with  . Vaginal Itching    (Consider location/radiation/quality/duration/timing/severity/associated sxs/prior treatment) HPI Jodi Estrada is a 26 y.o. female who presents to [facility] for vaginal itching that started yesterday. Current sex partner x 2 years. Last pap 2010 and was abnormal and had follow up. Hx of HSV.  Had surgery 01/29/12 for ruptured ectopic but has done fine since then. The history was provided by the patient.  Past Medical History  Diagnosis Date  . Genital herpes   . Bacterial vaginosis   . Ectopic pregnancy   . Abnormal Pap smear   . Chlamydia   . Trichomonas     Past Surgical History  Procedure Date  . Dilation and curettage of uterus   . Laparoscopy 01/29/2012    Procedure: LAPAROSCOPY OPERATIVE;  Surgeon: Hollie Salk C. Marice Potter, MD;  Location: [facility];  Service: Gynecology;  Laterality: N/A;  operative laparoscopy. Right salpingectomy with removal of ectopic pregnancy    Family History  Problem Relation Age of Onset  . Hypertension Maternal Grandmother   . Diabetes Maternal Grandmother   . Anesthesia problems Neg Hx     History  Substance Use Topics  . Smoking status: Current Everyday Smoker -- 0.5 packs/day for 5 years    Types: Cigarettes  . Smokeless tobacco: Never Used  . Alcohol Use: No    OB History    Grav Para Term Preterm Abortions TAB SAB Ect Mult Living   5 3 3  0 2 1 0 1 0 3      Review of Systems  Constitutional: Negative for fever, chills, diaphoresis and fatigue.  HENT: Negative for ear pain, congestion, sore throat, facial swelling, neck pain, neck stiffness, dental problem and sinus pressure.   Eyes: Negative for photophobia, pain and discharge.  Respiratory: Negative for cough, chest tightness and wheezing.   Gastrointestinal: Negative for nausea, vomiting, abdominal pain, diarrhea, constipation and abdominal distention.    Genitourinary: Positive for vaginal bleeding and vaginal discharge. Negative for dysuria, frequency, flank pain and difficulty urinating.  Musculoskeletal: Negative for myalgias, back pain and gait problem.  Skin: Negative for color change and rash.  Neurological: Negative for dizziness, speech difficulty, weakness, light-headedness, numbness and headaches.  Psychiatric/Behavioral: Negative for confusion and agitation.    Allergies  Review of patient's allergies indicates no known allergies.  Home Medications  No current outpatient prescriptions on file.  BP 115/68  Pulse 77  Temp(Src) 98.1 F (36.7 C) (Oral)  Resp 18  Ht 5\' 6"  (1.676 m)  Wt 198 lb (89.812 kg)  BMI 31.96 kg/m2  SpO2 100%  LMP 12/02/2011  Breastfeeding? Unknown  Physical Exam  Nursing note and vitals reviewed. Constitutional: She is oriented to person, place, and time. She appears well-developed and well-nourished.  HENT:  Head: Normocephalic.  Eyes: EOM are normal.  Neck: Neck supple.  Cardiovascular: Normal rate.   Pulmonary/Chest: Effort normal.  Abdominal: Soft. There is no tenderness.  Genitourinary:       External genitalia without lesions. Frothy vaginal discharge, blood tinged. Cervix inflamed. Uterus without palpable enlargement.  Musculoskeletal: Normal range of motion.  Neurological: She is alert and oriented to person, place, and time. No cranial nerve deficit.  Skin: Skin is warm and dry.  Psychiatric: She has a normal mood and affect. Her behavior is normal. Judgment and thought content normal.   Results for  orders placed during the hospital encounter of 02/09/12 (from the past 24 hour(s))  WET PREP, GENITAL     Status: Abnormal   Collection Time   02/09/12  1:25 PM      Component Value Range   Yeast Wet Prep HPF POC FEW (*) NONE SEEN    Trich, Wet Prep FEW (*) NONE SEEN    Clue Cells Wet Prep HPF POC NONE SEEN  NONE SEEN    WBC, Wet Prep HPF POC MODERATE (*) NONE SEEN     Assessment: Monilia vaginitis   Trichomonas vaginitis  Plan:  Diflucan 150 mg po now   Flagyl 500 mg po bid x 7 days   Follow up in [facility] Clinic ED Course  Procedures   MDM          [facility], NP 02/09/12 [phone]

## 2012-02-09 NOTE — Discharge Instructions (Signed)
Candida Infection, Adult A candida infection (also called yeast, fungus and Monilia infection) is an overgrowth of yeast that can occur anywhere on the body. A yeast infection commonly occurs in warm, moist body areas. Usually, the infection remains localized but can spread to become a systemic infection. A yeast infection may be a sign of a more severe disease such as diabetes, leukemia, or AIDS. A yeast infection can occur in both men and women. In women, Candida vaginitis is a vaginal infection. It is one of the most common causes of vaginitis. Men usually do not have symptoms or know they have an infection until other problems develop. Men may find out they have a yeast infection because their sex partner has a yeast infection. Uncircumcised men are more likely to get a yeast infection than circumcised men. This is because the uncircumcised glans is not exposed to air and does not remain as dry as that of a circumcised glans. Older adults may develop yeast infections around dentures. CAUSES  Women  Antibiotics.   Steroid medication taken for a long time.   Being overweight (obese).   Diabetes.   Poor immune condition.   Certain serious medical conditions.   Immune suppressive medications for organ transplant patients.   Chemotherapy.   Pregnancy.   Menstration.   Stress and fatigue.   Intravenous drug use.   Oral contraceptives.   Wearing tight-fitting clothes in the crotch area.   Catching it from a sex partner who has a yeast infection.   Spermicide.   Intravenous, urinary, or other catheters.  Men  Catching it from a sex partner who has a yeast infection.   Having oral or anal sex with a person who has the infection.   Spermicide.   Diabetes.   Antibiotics.   Poor immune system.   Medications that suppress the immune system.   Intravenous drug use.   Intravenous, urinary, or other catheters.  SYMPTOMS  Women  Thick, white vaginal discharge.    Vaginal itching.   Redness and swelling in and around the vagina.   Irritation of the lips of the vagina and perineum.   Blisters on the vaginal lips and perineum.   Painful sexual intercourse.   Low blood sugar (hypoglycemia).   Painful urination.   Bladder infections.   Intestinal problems such as constipation, indigestion, bad breath, bloating, increase in gas, diarrhea, or loose stools.  Men  Men may develop intestinal problems such as constipation, indigestion, bad breath, bloating, increase in gas, diarrhea, or loose stools.   Dry, cracked skin on the penis with itching or discomfort.   Jock itch.   Dry, flaky skin.   Athlete's foot.   Hypoglycemia.  DIAGNOSIS  Women  A history and an exam are performed.   The discharge may be examined under a microscope.   A culture may be taken of the discharge.  Men  A history and an exam are performed.   Any discharge from the penis or areas of cracked skin will be looked at under the microscope and cultured.   Stool samples may be cultured.  TREATMENT  Women  Vaginal antifungal suppositories and creams.   Medicated creams to decrease irritation and itching on the outside of the vagina.   Warm compresses to the perineal area to decrease swelling and discomfort.   Oral antifungal medications.   Medicated vaginal suppositories or cream for repeated or recurrent infections.   Wash and dry the irritation areas before applying the cream.     Eating yogurt with lactobacillus may help with prevention and treatment.   Sometimes painting the vagina with gentian violet solution may help if creams and suppositories do not work.  Men  Antifungal creams and oral antifungal medications.   Sometimes treatment must continue for 30 days after the symptoms go away to prevent recurrence.  HOME CARE INSTRUCTIONS  Women  Use cotton underwear and avoid tight-fitting clothing.   Avoid colored, scented toilet paper and  deodorant tampons or pads.   Do not douche.   Keep your diabetes under control.   Finish all the prescribed medications.   Keep your skin clean and dry.   Consume milk or yogurt with lactobacillus active culture regularly. If you get frequent yeast infections and think that is what the infection is, there are over-the-counter medications that you can get. If the infection does not show healing in 3 days, talk to your caregiver.   Tell your sex partner you have a yeast infection. Your partner may need treatment also, especially if your infection does not clear up or recurs.  Men  Keep your skin clean and dry.   Keep your diabetes under control.   Finish all prescribed medications.   Tell your sex partner that you have a yeast infection so they can be treated if necessary.  SEEK MEDICAL CARE IF:   Your symptoms do not clear up or worsen in one week after treatment.   You have an oral temperature above 102 F (38.9 C).   You have trouble swallowing or eating for a prolonged time.   You develop blisters on and around your vagina.   You develop vaginal bleeding and it is not your menstrual period.   You develop abdominal pain.   You develop intestinal problems as mentioned above.   You get weak or lightheaded.   You have painful or increased urination.   You have pain during sexual intercourse.  MAKE SURE YOU:   Understand these instructions.   Will watch your condition.   Will get help right away if you are not doing well or get worse.  Document Released: 01/05/2005 Document Revised: 08/10/2011 Document Reviewed: 04/19/2010 [facility] Patient Information 2012 [place], [place].Trichomoniasis Trichomoniasis is an infection, caused by the Trichomonas organism, that affects both women and men. In women, the outer female genitalia and the vagina are affected. In men, the penis is mainly affected, but the prostate and other reproductive organs can also be involved.  Trichomoniasis is a sexually transmitted disease (STD) and is most often passed to another person through sexual contact. The majority of people who get trichomoniasis, do so from a sexual encounter and are also at risk for other STDs, including gonorrhea and chlamydia, hepatitis B, and HIV. Trichomoniasis can increase the risk for HIV and pregnancy problems. SYMPTOMS   Abnormal gray-green, frothy vaginal discharge in women.   Itching and irritation of the area outside the vagina in women.   Penile discharge with or without pain in males.   Inflammation of the urethra (urethritis), causing painful urination.   Bleeding after sexual intercourse.  HOME CARE INSTRUCTIONS   Take all medication prescribed by your caregiver.   Take over-the-counter medication for itching or irritation as directed by your caregiver.   Do not have sexual intercourse while you have the infection.   Do not douche or wear tampons while you have the infection.   Discuss the infection with your partner, as your partner may have acquired the infection from you. Or, your partner  may have been the person who transmitted the infection to you.   Have your sex partner examined and treated if necessary.   Practice safe, informed, and protected sex.   See your caregiver for other STD testing.  SEEK MEDICAL CARE IF:  You still have symptoms after you finish the medication.   You have an oral temperature above 102 F (38.9 C).   You develop belly (abdominal) pain.   You have pain when you urinate.   You have bleeding after sexual intercourse.   You develop a rash.   The medication makes you sick or throw up (vomit).  Document Released: 01/05/2005 Document Revised: 08/10/2011 Document Reviewed: 06/19/2009 [facility] Patient Information 2012 [place], [place].

## 2012-02-09 NOTE — ED Provider Notes (Signed)
Medical Screening exam and patient care preformed by advanced practice provider.  Agree with the above management.  

## 2012-03-07 ENCOUNTER — Encounter: Payer: Self-pay | Admitting: Obstetrics & Gynecology

## 2012-03-07 ENCOUNTER — Ambulatory Visit (INDEPENDENT_AMBULATORY_CARE_PROVIDER_SITE_OTHER): Payer: Medicaid Other | Admitting: Obstetrics & Gynecology

## 2012-03-07 DIAGNOSIS — Z309 Encounter for contraceptive management, unspecified: Secondary | ICD-10-CM

## 2012-03-07 DIAGNOSIS — IMO0001 Reserved for inherently not codable concepts without codable children: Secondary | ICD-10-CM

## 2012-03-07 DIAGNOSIS — R32 Unspecified urinary incontinence: Secondary | ICD-10-CM

## 2012-03-07 DIAGNOSIS — Z09 Encounter for follow-up examination after completed treatment for conditions other than malignant neoplasm: Secondary | ICD-10-CM

## 2012-03-07 LAB — POCT URINALYSIS DIP (DEVICE)
Bilirubin Urine: NEGATIVE
Glucose, UA: NEGATIVE mg/dL
Ketones, ur: NEGATIVE mg/dL
Leukocytes, UA: NEGATIVE
Nitrite: NEGATIVE
pH: 6.5 (ref 5.0–8.0)

## 2012-03-07 MED ORDER — ETONOGESTREL-ETHINYL ESTRADIOL 0.12-0.015 MG/24HR VA RING: VAGINAL_RING | VAGINAL | Status: DC

## 2012-03-07 NOTE — Progress Notes (Signed)
  Subjective:    Patient ID: Jodi Estrada, female    DOB: 10-Dec-1986, 26 y.o.   MRN: [id number]  HPI Ms. Pflieger is here 6 weeks post op after having a Laproscopic right salpingectomy due to a ruptured ectopic pregnancy. She was diagnosed with trich in the [facility]U in 2/13 and took the flagyl. She has used Nuvaring in the past for birth control and would like to restart that today (on period now). She has not had sex since surgery. She complains of occasional stress urinary incontinece since surgery. Her UA here otday is negative   Review of Systems History of abnormal paps at [facility]    Objective:   Physical Exam   Well-healed incisions Benign abdomen     Assessment & Plan:  Post op doing well Contraception- restart Nuvaring Incontinence- check UC&S RTC 1 month for annual and follow up on Nuvaring

## 2012-03-07 NOTE — Progress Notes (Signed)
Pt states last Pap and GYN exam was 2011 @ [facility]. She will make appt for next exam.  She is interested in Nuvaring or Nexplanon for birth control. Pt reports urge incontinence since surgery- CCUA obtained.

## 2012-03-09 LAB — URINE CULTURE: Colony Count: NO GROWTH

## 2012-04-02 ENCOUNTER — Ambulatory Visit: Payer: Medicaid Other | Admitting: Advanced Practice Midwife

## 2012-04-25 ENCOUNTER — Ambulatory Visit (INDEPENDENT_AMBULATORY_CARE_PROVIDER_SITE_OTHER): Payer: Medicaid Other | Admitting: Advanced Practice Midwife

## 2012-04-25 ENCOUNTER — Other Ambulatory Visit (HOSPITAL_COMMUNITY)
Admission: RE | Admit: 2012-04-25 | Discharge: 2012-04-25 | Disposition: A | Discharge status: Home / Self Care | Payer: Medicaid Other | Source: Ambulatory Visit | Attending: Family | Admitting: Family

## 2012-04-25 ENCOUNTER — Encounter: Payer: Self-pay | Admitting: Advanced Practice Midwife

## 2012-04-25 VITALS — Temp 99.1°F | Resp 20 | Wt 199.4 lb

## 2012-04-25 DIAGNOSIS — Z01419 Encounter for gynecological examination (general) (routine) without abnormal findings: Secondary | ICD-10-CM

## 2012-04-25 DIAGNOSIS — Z113 Encounter for screening for infections with a predominantly sexual mode of transmission: Secondary | ICD-10-CM | POA: Insufficient documentation

## 2012-04-25 DIAGNOSIS — Z8759 Personal history of other complications of pregnancy, childbirth and the puerperium: Secondary | ICD-10-CM

## 2012-04-25 DIAGNOSIS — A6000 Herpesviral infection of urogenital system, unspecified: Secondary | ICD-10-CM | POA: Insufficient documentation

## 2012-04-25 DIAGNOSIS — Z8742 Personal history of other diseases of the female genital tract: Secondary | ICD-10-CM

## 2012-04-25 DIAGNOSIS — Z87898 Personal history of other specified conditions: Secondary | ICD-10-CM | POA: Insufficient documentation

## 2012-04-25 DIAGNOSIS — R3915 Urgency of urination: Secondary | ICD-10-CM

## 2012-04-25 DIAGNOSIS — Z124 Encounter for screening for malignant neoplasm of cervix: Secondary | ICD-10-CM

## 2012-04-25 LAB — POCT URINALYSIS DIP (DEVICE)
Ketones, ur: NEGATIVE mg/dL
Leukocytes, UA: NEGATIVE
Nitrite: NEGATIVE
Protein, ur: NEGATIVE mg/dL
Urobilinogen, UA: 1 mg/dL (ref 0.0–1.0)
pH: 7 (ref 5.0–8.0)

## 2012-04-25 MED ORDER — NYSTATIN-TRIAMCINOLONE 100000-0.1 UNIT/GM-% EX OINT: TOPICAL_OINTMENT | Freq: Two times a day (BID) | CUTANEOUS | Status: DC

## 2012-04-25 NOTE — Progress Notes (Signed)
  Subjective:     Jodi Estrada is a 26 y.o. female here for a routine exam.  Current complaints: 6 weeks of greenish vaginal d/c with itching and burning. States she was treated for trichomonas 4 weeks ago. She removed her Nuva ring d/t her current vaginal discomfort.    Gynecologic History Patient's last menstrual period was 04/01/2012. Contraception: NuvaRing vaginal inserts and  currently removed. Last Pap: 2010. Results were: abnormal and showed ASCUS    Obstetric History OB History    Grav Para Term Preterm Abortions TAB SAB Ect Mult Living   5 3 3  0 2 1 0 1 0 3     # Outc Date GA Lbr Len/2nd Wgt Sex Del Anes PTL Lv   1 TRM 6/05    F SVD  No Yes   Comments: low birth wt   2 TRM 1/07    M SVD  No Yes   3 TRM 5/09    M SVD  No Yes   4 TAB            5 ECT                The following portions of the patient's history were reviewed and updated as appropriate: She  has a past medical history of Genital herpes; Bacterial vaginosis; Ectopic pregnancy; Abnormal Pap smear; Chlamydia; and Trichomonas. She  does not have any pertinent problems on file. She  has past surgical history that includes Dilation and curettage of uterus and laparoscopy (01/29/2012). Her family history includes Diabetes in her maternal grandmother; Hypertension in her maternal grandmother; and Migraines in her sister.  There is no history of Anesthesia problems. She  reports that she has been smoking Cigarettes.  She has a 2.5 pack-year smoking history. She has never used smokeless tobacco. She reports that she drinks alcohol. She reports that she uses illicit drugs (Marijuana). She has a current medication list which includes the following prescription(s): etonogestrel-ethinyl estradiol, nystatin-triamcinolone ointment, and oxycodone-acetaminophen..  Review of Systems Genitourinary:positive for vaginal discharge and burning, itching.     Objective:    Temp(Src) 99.1 F (37.3 C) (Oral)  Resp 20  Wt 199  lb 6.4 oz (90.447 kg)  LMP 04/01/2012  General Appearance:    Alert, cooperative, no distress, appears stated age  Head:    Normocephalic, without obvious abnormality, atraumatic              Neck:   symmetrical, trachea midline;  thyroid:  no enlargement/tenderness/nodules;       Lungs:     Clear to auscultation bilaterally, respirations unlabored      Heart:    Regular rate and rhythm, S1 and S2 normal, no murmur, rub   or gallop  Breast Exam:    No tenderness, masses, or nipple abnormality     Genitalia:     Erythematous scaly skin along the sides of the peroneum; No discharge or tenderness                        Assessment:    Vaginal candidiasis   Plan:    Contraception: NuvaRing vaginal inserts. GC/ Chlamydia  Wet prep Mycolog rx to pharmacy Will review results and determine appropriate follow.

## 2012-04-25 NOTE — Patient Instructions (Signed)
Pap Test A Pap test checks the cells in the cervix for any infections or changes that may turn into cancer. Have a Pap test every year if:  You are having sex (intercourse).   You are high risk for cervical cancer. You are high risk if:   You started having sex at a very young age.   You have sex with a lot of different partners.   You smoke.   You have human papillomavirus (HPV).   You have HIV.  BEFORE THE PROCEDURE  Schedule the Pap test before or after your period.   Do not have sex for 2 days before your Pap test.   For 3 days before your Pap test, do not douche or put any creams or medicines in your vagina.  PROCEDURE  Try to relax. You will be more comfortable. Try breathing slowly and deeply. Do not tighten up your muscles.   A tool (speculum) is put into your vagina and opens it up.   Your doctor will use a special swab or brush to take cells from your cervix.   You may feel pressure or pulling as the cells are taken off your cervix.   The cells will be checked under a microscope.  AFTER THE PROCEDURE You may have a few spots of blood from your vagina for 2 to 3 days. If you have a lot of bleeding, or it does not stop in a few days, tell your doctor. Finding out the results of your test Ask when your test results will be ready. Make sure you get your test results. PAP TEST RECOMMENDATIONS  Your first Pap test should be done at age 34.   Pap tests are repeated every 2 years between ages 77 and 69.   Beginning at age 76, you should have a Pap test every 3 years as long as your past 3 PAP tests have been normal.   If you are at risk of getting cervical cancer. Talk to your caregiver if a new problem shows up soon after your last Pap test.   If you have had past treatment for cervical cancer or a condition that could lead to cancer, you need Pap tests and screening for cancer for at least 20 years after your treatment.  WHO DOES NOT NEED TO HAVE A PAP TEST? You  do not need a Pap test if:  If you had a hysterectomy for a problem that was not cancer or a condition that could lead to cancer. A regular exam is still recommended.    If you are between ages 73 and 15, and you have had normal Pap tests for 10 years. A regular exam is still recommended.    If Pap tests have been discontinued, risk factors (such as a new sexual partner)need to be looked at again to see if screening should be started again.  Document Released: 12/31/2010 Document Revised: 11/17/2011 Document Reviewed: 12/31/2010 [facility] Patient Information 2012 [place], [place].  Safer Sex Your caregiver wants you to have this information about the infections that can be transmitted from sexual contact and how to prevent them. The idea behind safer sex is that you can be sexually active, and at the same time reduce the risk of giving or getting a sexually transmitted disease (STD). Every person should be aware of how to prevent him or herself and his or her sex partner from getting an STD. CAUSES OF STDS STDs are transmitted by sharing body fluids, which contain viruses and  bacteria. The following fluids all transmit infections during sexual intercourse and sex acts:  Semen.   Saliva.   Urine.   Blood.   Vaginal mucus.  Examples of STDs include:  Chlamydia.   Gonorrhea.   Genital herpes.   Hepatitis B.   Human immunodeficiency virus or acquired immunodeficiency syndrome (HIV or AIDS).   Syphilis.   Trichomonas.   Pubic lice.   Human papillomavirus (HPV), which may include:   Genital warts.   Cervical dysplasia.   Cervical cancer (can develop with certain types of HPV).  SYMPTOMS  Sexual diseases often cause few or no symptoms until they are advanced, so a person can be infected and spread the infection without knowing it. Some STDs respond to treatment very well. Others, like HIV and herpes, cannot be cured, but are treated to reduce their effects. Specific  symptoms include:  Abnormal vaginal discharge.   Irritation or itching in and around the vagina, and in the pubic hair.   Pain during sexual intercourse.   Bleeding during sexual intercourse.   Pelvic or abdominal pain.   Fever.   Growths in and around the vagina.   An ulcer in or around the vagina.   Swollen glands in the groin area.  DIAGNOSIS   Blood tests.   Pap test.   Culture test of abnormal vaginal discharge.   A test that applies a solution and examines the cervix with a lighted magnifying scope (colposcopy).   A test that examines the pelvis with a lighted tube, through a small incision (laparoscopy).  TREATMENT  The treatment will depend on the cause of the STD.  Antibiotic treatment by injection, oral, creams, or suppositories in the vagina.   Over-the-counter medicated shampoo, to get rid of pubic lice.   Removing or treating growths with medicine, freezing, burning (electrocautery), or surgery.   Surgery treatment for HPV of the cervix.   Supportive medicines for herpes, HIV, AIDS, and hepatitis.  Being careful cannot eliminate all risk of infection, but sex can be made much safer. Safe sexual practices include body massage and gentle touching. Masturbation is safe, as long as body fluids do not contact skin that has sores or cuts. Dry kissing and oral sex on a man wearing a latex condom or on a woman wearing a female condom is also safe. Slightly less safe is intercourse while the man wears a latex condom or wet kissing. It is also safer to have one sex partner that you know is not having sex with anyone else. LENGTH OF ILLNESS An STD might be treated and cured in a week, sometimes a month, or more. And it can linger with symptoms for many years. STDs can also cause damage to the female organs. This can cause chronic pain, infertility, and recurrence of the STD, especially herpes, hepatitis, HIV, and HPV. HOME CARE INSTRUCTIONS AND PREVENTION  Alcohol  and recreational drugs are often the reason given for not practicing safer sex. These substances affect your judgment. Alcohol and recreational drugs can also impair your immune system, making you more vulnerable to disease.   Do not engage in risky and dangerous sexual practices, including:   Vaginal or anal sex without a condom.   Oral sex on a man without a condom.   Oral sex on a woman without a female condom.   Using saliva to lubricate a condom.   Any other sexual contact in which body fluids or blood from one partner contact the other partner.  You should use only latex condoms for men and water soluble lubricants. Petroleum based lubricants or oils used to lubricate a condom will weaken the condom and increase the chance that it will break.   Think very carefully before having sex with anyone who is high risk for STDs and HIV. This includes IV drug users, people with multiple sexual partners, or people who have had an STD, or a positive hepatitis or HIV blood test.   Remember that even if your partner has had only one previous partner, their previous partner might have had multiple partners. If so, you are at high risk of being exposed to an STD. You and your sex partner should be the only sex partners with each other, with no one else involved.   A vaccine is available for hepatitis B and HPV through your caregiver or the Public Health Department. Everyone should be vaccinated with these vaccines.   Avoid risky sex practices. Sex acts that can break the skin make you more likely to get an STD.  SEEK MEDICAL CARE IF:   If you think you have an STD, even if you do not have any symptoms. Contact your caregiver for evaluation and treatment, if needed.   You think or know your sex partner has acquired an STD.   You have any of the symptoms mentioned above.  Document Released: 01/05/2005 Document Revised: 11/17/2011 Document Reviewed: 10/28/2009 [facility] Patient Information 2012  [place], [place].

## 2012-04-25 NOTE — Progress Notes (Signed)
Pt reports urgency of urination since ectopic pregnancy.  Random UA done- negative.   Pt has not been using Nuvaring due to vaginal itching.

## 2012-04-26 LAB — WET PREP, GENITAL: Trich, Wet Prep: NONE SEEN

## 2012-05-17 ENCOUNTER — Telehealth: Payer: Self-pay | Admitting: *Deleted

## 2012-05-17 NOTE — Telephone Encounter (Signed)
Pt called requesting results from her visit on 04/25/12. I informed her of pap and wet prep results. Pt voiced understanding.

## 2012-06-25 ENCOUNTER — Ambulatory Visit: Payer: Medicaid Other

## 2012-09-24 ENCOUNTER — Encounter (HOSPITAL_COMMUNITY): Payer: Self-pay | Admitting: *Deleted

## 2012-09-24 ENCOUNTER — Inpatient Hospital Stay (HOSPITAL_COMMUNITY)
Admission: AD | Admit: 2012-09-24 | Discharge: 2012-09-24 | Disposition: A | Discharge status: Home / Self Care | Payer: Medicaid Other | Source: Ambulatory Visit | Attending: Obstetrics and Gynecology | Admitting: Obstetrics and Gynecology

## 2012-09-24 DIAGNOSIS — L293 Anogenital pruritus, unspecified: Secondary | ICD-10-CM | POA: Insufficient documentation

## 2012-09-24 DIAGNOSIS — A599 Trichomoniasis, unspecified: Secondary | ICD-10-CM

## 2012-09-24 DIAGNOSIS — N949 Unspecified condition associated with female genital organs and menstrual cycle: Secondary | ICD-10-CM | POA: Insufficient documentation

## 2012-09-24 DIAGNOSIS — A5901 Trichomonal vulvovaginitis: Secondary | ICD-10-CM | POA: Insufficient documentation

## 2012-09-24 LAB — URINE MICROSCOPIC-ADD ON

## 2012-09-24 LAB — WET PREP, GENITAL: Yeast Wet Prep HPF POC: NONE SEEN

## 2012-09-24 LAB — URINALYSIS, ROUTINE W REFLEX MICROSCOPIC
Bilirubin Urine: NEGATIVE
Ketones, ur: NEGATIVE mg/dL
Leukocytes, UA: NEGATIVE
Nitrite: NEGATIVE
Protein, ur: NEGATIVE mg/dL
Urobilinogen, UA: 0.2 mg/dL (ref 0.0–1.0)

## 2012-09-24 LAB — POCT PREGNANCY, URINE: Preg Test, Ur: NEGATIVE

## 2012-09-24 MED ORDER — METRONIDAZOLE 500 MG PO TABS
2000.0000 mg | ORAL_TABLET | Freq: Once | ORAL | Status: AC
  Administered 2012-09-24: 2000 mg via ORAL
  Filled 2012-09-24: qty 4

## 2012-09-24 NOTE — [facility] Note (Signed)
Pt states she has had a smelly vaginal discharge with itching x 4 days.  Pt states the itching is more on the labia than in the vagina.

## 2012-10-06 ENCOUNTER — Encounter (HOSPITAL_COMMUNITY): Payer: Self-pay | Admitting: *Deleted

## 2012-10-06 ENCOUNTER — Inpatient Hospital Stay (HOSPITAL_COMMUNITY)
Admission: AD | Admit: 2012-10-06 | Discharge: 2012-10-06 | Disposition: A | Discharge status: Home / Self Care | Payer: Medicaid Other | Source: Ambulatory Visit | Attending: Obstetrics & Gynecology | Admitting: Obstetrics & Gynecology

## 2012-10-06 DIAGNOSIS — B373 Candidiasis of vulva and vagina: Secondary | ICD-10-CM

## 2012-10-06 DIAGNOSIS — L293 Anogenital pruritus, unspecified: Secondary | ICD-10-CM | POA: Insufficient documentation

## 2012-10-06 DIAGNOSIS — B3731 Acute candidiasis of vulva and vagina: Secondary | ICD-10-CM

## 2012-10-06 LAB — WET PREP, GENITAL

## 2012-10-06 MED ORDER — FLUCONAZOLE 150 MG PO TABS: 150.0000 mg | ORAL_TABLET | Freq: Once | ORAL | Status: DC

## 2012-10-06 MED ORDER — NYSTATIN-TRIAMCINOLONE 100000-0.1 UNIT/GM-% EX OINT: TOPICAL_OINTMENT | Freq: Two times a day (BID) | CUTANEOUS | Status: DC

## 2012-10-06 NOTE — MAU Provider Note (Signed)
History     CSN: [id number]  Arrival date and time: 10/06/12 1755   None     Chief Complaint  Patient presents with  . Vaginal Itching   HPI 26 y.o. [id number] with vaginal itching. Treated for trich last week with Falgyl 2 g PO, discharge improved.    Past Medical History  Diagnosis Date  . Genital herpes   . Bacterial vaginosis   . Ectopic pregnancy   . Abnormal Pap smear   . Chlamydia   . Trichomonas     Past Surgical History  Procedure Date  . Dilation and curettage of uterus   . Laparoscopy 01/29/2012    Procedure: LAPAROSCOPY OPERATIVE;  Surgeon: Hollie Salk C. Marice Potter, MD;  Location: [facility];  Service: Gynecology;  Laterality: N/A;  operative laparoscopy. Right salpingectomy with removal of ectopic pregnancy    Family History  Problem Relation Age of Onset  . Hypertension Maternal Grandmother   . Diabetes Maternal Grandmother   . Anesthesia problems Neg Hx   . Migraines Sister     History  Substance Use Topics  . Smoking status: Current Every Day Smoker -- 0.5 packs/day for 5 years    Types: Cigarettes  . Smokeless tobacco: Never Used  . Alcohol Use: Yes     occasionally- social    Allergies: No Known Allergies  Prescriptions prior to admission  Medication Sig Dispense Refill  . etonogestrel-ethinyl estradiol (NUVARING) 0.12-0.015 MG/24HR vaginal ring Insert vaginally and leave in place for 3 consecutive weeks, then remove for 1 week.  1 each  12    Review of Systems  Constitutional: Negative.   Respiratory: Negative.   Cardiovascular: Negative.   Gastrointestinal: Negative for nausea, vomiting, abdominal pain, diarrhea and constipation.  Genitourinary: Negative for dysuria, urgency, frequency, hematuria and flank pain.       Negative for vaginal bleeding, vaginal discharge, dyspareunia  Musculoskeletal: Negative.   Neurological: Negative.   Psychiatric/Behavioral: Negative.    Physical Exam   Blood pressure 123/78, pulse 96, temperature 98.5 F (36.9  C), temperature source Oral, resp. rate 16, height 5' 4.5" (1.638 m), weight 209 lb (94.802 kg), last menstrual period 09/15/2012.  Physical Exam  Nursing note and vitals reviewed. Constitutional: She is oriented to person, place, and time. She appears well-developed and well-nourished. No distress.  Cardiovascular: Normal rate.   Respiratory: Effort normal.  GI: Soft. There is no tenderness.  Genitourinary: There is tenderness on the right labia. There is no lesion on the right labia. There is tenderness on the left labia. There is no lesion on the left labia. Uterus is not enlarged and not tender. Cervix exhibits no motion tenderness and no discharge. Right adnexum displays no mass, no tenderness and no fullness. Left adnexum displays no mass, no tenderness and no fullness. No bleeding around the vagina. Vaginal discharge (curdlike) found.       Erythema, irritation bilateral labia   Musculoskeletal: Normal range of motion.  Neurological: She is alert and oriented to person, place, and time.  Skin: Skin is warm and dry.  Psychiatric: She has a normal mood and affect.    MAU Course  Procedures  Microscopic wet-mount exam shows few clue cells, yeast.    Assessment and Plan  . 1. Vaginal candida       Medication List     As of 10/13/2012  4:05 PM    START taking these medications         fluconazole 150 MG tablet   Commonly known  as: DIFLUCAN   Take 1 tablet (150 mg total) by mouth once. Repeat dose in 2 days.      nystatin-triamcinolone ointment   Commonly known as: MYCOLOG   Apply topically 2 (two) times daily.      CONTINUE taking these medications         etonogestrel-ethinyl estradiol 0.12-0.015 MG/24HR vaginal ring   Commonly known as: NUVARING   Insert vaginally and leave in place for 3 consecutive weeks, then remove for 1 week.          Where to get your medications    These are the prescriptions that you need to pick up. We sent them to a specific  pharmacy, so you will need to go there to get them.   [facility] [address] [place], [place] - [address]    [address] [place] [address]    Phone: [phone]        fluconazole 150 MG tablet   nystatin-triamcinolone ointment            Follow-up Information    Follow up with [facility]. (As needed)    Contact information:   [address] [place] [address] [phone]           Kyandre Okray 10/06/2012, 6:31 PM

## 2012-10-06 NOTE — MAU Note (Signed)
Treated last week for trich; concerned that she has been re-infected or has yeast now;

## 2012-10-06 NOTE — MAU Note (Signed)
Pt was here last week and was treated for Trichomonas. Stated she is having itching now and thinks she may have a yeast infection. No discharge noted

## 2012-11-05 ENCOUNTER — Encounter (HOSPITAL_COMMUNITY): Payer: Self-pay | Admitting: *Deleted

## 2012-11-05 ENCOUNTER — Inpatient Hospital Stay (HOSPITAL_COMMUNITY)
Admission: AD | Admit: 2012-11-05 | Discharge: 2012-11-05 | Disposition: A | Discharge status: Home / Self Care | Payer: Medicaid Other | Source: Ambulatory Visit | Attending: Obstetrics & Gynecology | Admitting: Obstetrics & Gynecology

## 2012-11-05 DIAGNOSIS — B373 Candidiasis of vulva and vagina: Secondary | ICD-10-CM | POA: Insufficient documentation

## 2012-11-05 DIAGNOSIS — L293 Anogenital pruritus, unspecified: Secondary | ICD-10-CM | POA: Insufficient documentation

## 2012-11-05 DIAGNOSIS — Z202 Contact with and (suspected) exposure to infections with a predominantly sexual mode of transmission: Secondary | ICD-10-CM | POA: Insufficient documentation

## 2012-11-05 DIAGNOSIS — N949 Unspecified condition associated with female genital organs and menstrual cycle: Secondary | ICD-10-CM | POA: Insufficient documentation

## 2012-11-05 DIAGNOSIS — B3731 Acute candidiasis of vulva and vagina: Secondary | ICD-10-CM | POA: Insufficient documentation

## 2012-11-05 LAB — WET PREP, GENITAL: Trich, Wet Prep: NONE SEEN

## 2012-11-05 MED ORDER — METRONIDAZOLE 500 MG PO TABS
2000.0000 mg | ORAL_TABLET | Freq: Once | ORAL | Status: AC
  Administered 2012-11-05: 2000 mg via ORAL
  Filled 2012-11-05: qty 4

## 2012-11-05 MED ORDER — FLUCONAZOLE 150 MG PO TABS
150.0000 mg | ORAL_TABLET | Freq: Every day | ORAL | Status: DC
  Administered 2012-11-05: 150 mg via ORAL
  Filled 2012-11-05: qty 1

## 2012-11-05 NOTE — MAU Provider Note (Signed)
History     CSN: [id number]  Arrival date and time: 11/05/12 8841   First Provider Initiated Contact with Patient 11/05/12 [phone]      Chief Complaint  Patient presents with  . Vaginal Discharge   HPI 26 y.o. [id number] here with vaginal discharge and itching. Recently diagnosed and treated for Trich in MAU. States her partner was never treated and she had unprotected intercourse with him again. Thinks she probably has Trich again.   Past Medical History  Diagnosis Date  . Genital herpes   . Bacterial vaginosis   . Ectopic pregnancy   . Chlamydia   . Trichomonas   . Abnormal Pap smear     follow up was wnl    Past Surgical History  Procedure Date  . Dilation and curettage of uterus   . Laparoscopy 01/29/2012    Procedure: LAPAROSCOPY OPERATIVE;  Surgeon: Hollie Salk C. Marice Potter, MD;  Location: [facility];  Service: Gynecology;  Laterality: N/A;  operative laparoscopy. Right salpingectomy with removal of ectopic pregnancy    Family History  Problem Relation Age of Onset  . Hypertension Maternal Grandmother   . Diabetes Maternal Grandmother   . Anesthesia problems Neg Hx   . Other Neg Hx   . Migraines Sister     History  Substance Use Topics  . Smoking status: Current Every Day Smoker -- 0.5 packs/day for 5 years    Types: Cigarettes  . Smokeless tobacco: Never Used  . Alcohol Use: Yes     Comment: occasionally- social    Allergies: No Known Allergies  Prescriptions prior to admission  Medication Sig Dispense Refill  . etonogestrel-ethinyl estradiol (NUVARING) 0.12-0.015 MG/24HR vaginal ring Insert vaginally and leave in place for 3 consecutive weeks, then remove for 1 week.  1 each  12  . ibuprofen (ADVIL,MOTRIN) 200 MG tablet Take 400 mg by mouth daily as needed. For headaches        ROS Physical Exam   Blood pressure 122/80, pulse 81, temperature 98.2 F (36.8 C), resp. rate 18, last menstrual period 10/12/2012.  Physical Exam  Nursing note and vitals  reviewed. Constitutional: She is oriented to person, place, and time. She appears well-developed and well-nourished. No distress.  HENT:  Head: Normocephalic and atraumatic.  Cardiovascular: Normal rate.   Respiratory: Effort normal. No respiratory distress.  GI: Soft. She exhibits no distension and no mass. There is no tenderness. There is no rebound and no guarding.  Genitourinary: There is no rash or lesion on the right labia. There is no rash or lesion on the left labia. Uterus is not deviated, not enlarged, not fixed and not tender. Cervix exhibits friability (erythematous). Cervix exhibits no motion tenderness and no discharge. Right adnexum displays no mass, no tenderness and no fullness. Left adnexum displays no mass, no tenderness and no fullness. No erythema, tenderness or bleeding around the vagina. Vaginal discharge (white) found.  Neurological: She is alert and oriented to person, place, and time.  Skin: Skin is warm and dry.  Psychiatric: She has a normal mood and affect.    MAU Course  Procedures  Results for orders placed during the hospital encounter of 11/05/12 (from the past 24 hour(s))  WET PREP, GENITAL     Status: Abnormal   Collection Time   11/05/12  9:41 AM      Component Value Range   Yeast Wet Prep HPF POC FEW (*) NONE SEEN   Trich, Wet Prep NONE SEEN  NONE SEEN   Clue  Cells Wet Prep HPF POC FEW (*) NONE SEEN   WBC, Wet Prep HPF POC FEW (*) NONE SEEN     Assessment and Plan   1. Exposure to STD   2. Yeast vaginitis       Medication List     As of 11/05/2012  3:59 PM    CONTINUE taking these medications         etonogestrel-ethinyl estradiol 0.12-0.015 MG/24HR vaginal ring   Commonly known as: NUVARING   Insert vaginally and leave in place for 3 consecutive weeks, then remove for 1 week.      ibuprofen 200 MG tablet   Commonly known as: ADVIL,MOTRIN       Rx given for partner treatment for Christoper Allegra, encouraged no intercourse until 2 weeks  after both pt and partner are treated      Follow-up Information    Follow up with [facility] [place]. (As needed)    Contact information:   [address] [place] [address] [phone]           Evone Arseneau 11/05/2012, 9:43 AM

## 2012-11-05 NOTE — MAU Note (Signed)
Was here a few wks ago, was treated for trich.  Unsure if partner has been treated, has had intercourse since she was treated, thinks she has it again.

## 2012-11-05 NOTE — MAU Provider Note (Signed)
Attestation of Attending Supervision of Advanced Practitioner (CNM/NP): Evaluation and management procedures were performed by the Advanced Practitioner under my supervision and collaboration.  I have reviewed the Advanced Practitioner's note and chart, and I agree with the management and plan.  UGONNA  ANYANWU, MD, FACOG Attending Obstetrician & Gynecologist Faculty Practice, [facility]  

## 2012-12-21 ENCOUNTER — Encounter (HOSPITAL_COMMUNITY): Payer: Self-pay | Admitting: Emergency Medicine

## 2012-12-21 ENCOUNTER — Emergency Department (HOSPITAL_COMMUNITY)
Admission: EM | Admit: 2012-12-21 | Discharge: 2012-12-21 | Disposition: A | Discharge status: Home / Self Care | Payer: Medicaid Other | Attending: Emergency Medicine | Admitting: Emergency Medicine

## 2012-12-21 DIAGNOSIS — F17200 Nicotine dependence, unspecified, uncomplicated: Secondary | ICD-10-CM | POA: Insufficient documentation

## 2012-12-21 DIAGNOSIS — B86 Scabies: Secondary | ICD-10-CM | POA: Insufficient documentation

## 2012-12-21 DIAGNOSIS — Z8619 Personal history of other infectious and parasitic diseases: Secondary | ICD-10-CM | POA: Insufficient documentation

## 2012-12-21 MED ORDER — PERMETHRIN 5 % EX CREA: TOPICAL_CREAM | CUTANEOUS | Status: DC

## 2012-12-21 NOTE — ED Notes (Signed)
Mother has itchy rash to right forearm.

## 2012-12-22 NOTE — ED Provider Notes (Signed)
Medical screening examination/treatment/procedure(s) were performed by non-physician practitioner and as supervising physician I was immediately available for consultation/collaboration.   Shihab States, MD 12/22/12 1753 

## 2012-12-22 NOTE — ED Provider Notes (Signed)
History     CSN: [id number]  Arrival date & time 12/21/12  0615   First MD Initiated Contact with Patient 12/21/12 0622      Chief Complaint  Patient presents with  . Rash    (Consider location/radiation/quality/duration/timing/severity/associated sxs/prior treatment) HPI 27 y/o female who presents with his 4 family members with cc of itchy rash for 3 weeks. Rash is present on interdigital spaces, arms. Axillae  Torso and groin.  It is high pruritic. No Fever. Denies changes in lotions/soaps/detergents, exposure to animal or plant irritants, and denies purulent discharge.  Past Medical History  Diagnosis Date  . Genital herpes   . Bacterial vaginosis   . Ectopic pregnancy   . Chlamydia   . Trichomonas   . Abnormal Pap smear     follow up was wnl    Past Surgical History  Procedure Date  . Dilation and curettage of uterus   . Laparoscopy 01/29/2012    Procedure: LAPAROSCOPY OPERATIVE;  Surgeon: Hollie Salk C. Marice Potter, MD;  Location: [facility];  Service: Gynecology;  Laterality: N/A;  operative laparoscopy. Right salpingectomy with removal of ectopic pregnancy    Family History  Problem Relation Age of Onset  . Hypertension Maternal Grandmother   . Diabetes Maternal Grandmother   . Anesthesia problems Neg Hx   . Other Neg Hx   . Migraines Sister     History  Substance Use Topics  . Smoking status: Current Every Day Smoker -- 0.5 packs/day for 5 years    Types: Cigarettes  . Smokeless tobacco: Never Used  . Alcohol Use: Yes     Comment: occasionally- social    OB History    Grav Para Term Preterm Abortions TAB SAB Ect Mult Living   5 3 3  0 2 1 0 1 0 3      Review of Systems Ten systems reviewed and are negative for acute change, except as noted in the HPI.   Allergies  Review of patient's allergies indicates no known allergies.  Home Medications   Current Outpatient Rx  Name  Route  Sig  Dispense  Refill  . ETONOGESTREL-ETHINYL ESTRADIOL 0.12-0.015 MG/24HR VA  RING      Insert vaginally and leave in place for 3 consecutive weeks, then remove for 1 week.   1 each   12   . PERMETHRIN 5 % EX CREA      Massage cream thoroughly into the skin from neck to the soles of the feet including under the fingernails and toenails. Allow to stay on skin for 8-14 hours then shower to remove. Repeat in 1-2 weeks.   60 g   0     BP 130/88  Pulse 98  Temp 98.5 F (36.9 C) (Oral)  Resp 20  Wt 210 lb (95.255 kg)  SpO2 100%  Physical Exam  Nursing note and vitals reviewed. Constitutional: She is oriented to person, place, and time. She appears well-developed and well-nourished. No distress.  HENT:  Head: Normocephalic and atraumatic.  Eyes: Conjunctivae normal are normal. No scleral icterus.  Neck: Normal range of motion.  Cardiovascular: Normal rate, regular rhythm and normal heart sounds.  Exam reveals no gallop and no friction rub.   No murmur heard. Pulmonary/Chest: Effort normal and breath sounds normal. No respiratory distress.  Abdominal: Soft. Bowel sounds are normal. She exhibits no distension and no mass. There is no tenderness. There is no guarding.  Neurological: She is alert and oriented to person, place, and time.  Skin:  Skin is warm and dry. Rash noted. She is not diaphoretic.       Papular rash present on indergiital webs/hand, and forearm of rig extremity.    ED Course  Procedures (including critical care time)  Labs Reviewed - No data to display No results found.   1. Scabies       MDM  Discussed diagnosis & treatment of scabies with patient.   advised to followup with her primary care doctor 2 weeks after treatment.  advised to clean entire household including washing sheets and using R.I.D. spray in the car and on sofa.   The use of permethrin cream was discussed as well, they were told to use cream from head to toe & leave on for 8-12 hours.  advised to repeat treatment if new eruptions occur.verbalized understanding.           Arthor Captain, PA-C 12/22/12 1418

## 2013-02-19 ENCOUNTER — Inpatient Hospital Stay (HOSPITAL_COMMUNITY): Payer: Medicaid Other

## 2013-02-19 ENCOUNTER — Inpatient Hospital Stay (HOSPITAL_COMMUNITY)
Admission: AD | Admit: 2013-02-19 | Discharge: 2013-02-19 | Disposition: A | Discharge status: Home / Self Care | Payer: Medicaid Other | Source: Ambulatory Visit | Attending: Obstetrics & Gynecology | Admitting: Obstetrics & Gynecology

## 2013-02-19 ENCOUNTER — Encounter (HOSPITAL_COMMUNITY): Payer: Self-pay | Admitting: *Deleted

## 2013-02-19 DIAGNOSIS — Z3490 Encounter for supervision of normal pregnancy, unspecified, unspecified trimester: Secondary | ICD-10-CM

## 2013-02-19 DIAGNOSIS — O43891 Other placental disorders, first trimester: Secondary | ICD-10-CM

## 2013-02-19 DIAGNOSIS — Z3201 Encounter for pregnancy test, result positive: Secondary | ICD-10-CM | POA: Insufficient documentation

## 2013-02-19 LAB — WET PREP, GENITAL
Trich, Wet Prep: NONE SEEN
Yeast Wet Prep HPF POC: NONE SEEN

## 2013-02-19 NOTE — MAU Provider Note (Signed)
History    CSN: [id number]  Arrival date and time: 02/19/13 5284   First Provider Initiated Contact with Patient 02/19/13 [phone]      Chief Complaint  Patient presents with  . Possible Pregnancy   HPI  Pt is a [id number] with a history of ectopic pregnancy who presents today for pregnancy test and rule out ectopic. Her LMP was 01/05/13. She has no complaints today, however, reports feeling unwell for several days during last week, at which time she reports generalized fatigue and weakness, nausea with one episode of emesis, myalgias, and L-sided abdominal pain. She denies any fever/chills, rhinorrhea/congestion, sore throat, dysuria, suprapubic pain, vaginal discharge/bleeding. She plans to terminate this pregnancy.    Past Medical History  Diagnosis Date  . Genital herpes   . Bacterial vaginosis   . Ectopic pregnancy   . Chlamydia   . Trichomonas   . Abnormal Pap smear     follow up was wnl    Past Surgical History  Procedure Laterality Date  . Dilation and curettage of uterus    . Laparoscopy  01/29/2012    Procedure: LAPAROSCOPY OPERATIVE;  Surgeon: Hollie Salk C. Marice Potter, MD;  Location: [facility];  Service: Gynecology;  Laterality: N/A;  operative laparoscopy. Right salpingectomy with removal of ectopic pregnancy    Family History  Problem Relation Age of Onset  . Hypertension Maternal Grandmother   . Diabetes Maternal Grandmother   . Anesthesia problems Neg Hx   . Other Neg Hx   . Migraines Sister     History  Substance Use Topics  . Smoking status: Current Every Day Smoker -- 0.50 packs/day for 5 years    Types: Cigarettes  . Smokeless tobacco: Never Used  . Alcohol Use: Yes     Comment: occasionally- social    Allergies: No Known Allergies  Prescriptions prior to admission  Medication Sig Dispense Refill  . ibuprofen (ADVIL,MOTRIN) 200 MG tablet Take 400 mg by mouth daily as needed for pain (toothache).       Results for orders placed during the hospital encounter of  02/19/13 (from the past 24 hour(s))  POCT PREGNANCY, URINE     Status: Abnormal   Collection Time    02/19/13  8:48 AM      Result Value Range   Preg Test, Ur POSITIVE (*) NEGATIVE  HCG, QUANTITATIVE, PREGNANCY     Status: Abnormal   Collection Time    02/19/13  8:59 AM      Result Value Range   hCG, Beta Chain, Quant, [place] [address] (*) <5 mIU/mL  WET PREP, GENITAL     Status: Abnormal   Collection Time    02/19/13  9:11 AM      Result Value Range   Yeast Wet Prep HPF POC NONE SEEN  NONE SEEN   Trich, Wet Prep NONE SEEN  NONE SEEN   Clue Cells Wet Prep HPF POC FEW (*) NONE SEEN   WBC, Wet Prep HPF POC FEW (*) NONE SEEN     ROS  A 14 point review of systems was asked and pertinent positives and negatives are discussed in the HPI.  Physical Exam   Blood pressure 140/82, pulse 106, temperature 98.8 F (37.1 C), temperature source Oral, resp. rate 18, last menstrual period 01/05/2013.  Physical Exam  Constitutional: She appears well-developed and well-nourished. No distress.  GI: She exhibits no distension. There is no tenderness. There is no CVA tenderness.  Genitourinary: There is no rash, tenderness or lesion on  the right labia. There is no rash, tenderness or lesion on the left labia. Cervix exhibits discharge (White, non-odorous). Cervix exhibits no friability. Right adnexum displays no mass, no tenderness and no fullness. Left adnexum displays no mass, no tenderness and no fullness. No bleeding around the vagina. No vaginal discharge found.   Results for orders placed during the hospital encounter of 02/19/13 (from the past 168 hour(s))  POCT PREGNANCY, URINE   Collection Time    02/19/13  8:48 AM      Result Value Range   Preg Test, Ur POSITIVE (*) NEGATIVE  HCG, QUANTITATIVE, PREGNANCY   Collection Time    02/19/13  8:59 AM      Result Value Range   hCG, Beta Chain, [place] [address] (*) <5 mIU/mL  GC/CHLAMYDIA PROBE AMP   Collection Time    02/19/13  9:09 AM      Result  Value Range   CT Probe RNA NEGATIVE  NEGATIVE   GC Probe RNA NEGATIVE  NEGATIVE  WET PREP, GENITAL   Collection Time    02/19/13  9:11 AM      Result Value Range   Yeast Wet Prep HPF POC NONE SEEN  NONE SEEN   Trich, Wet Prep NONE SEEN  NONE SEEN   Clue Cells Wet Prep HPF POC FEW (*) NONE SEEN   WBC, Wet Prep HPF POC FEW (*) NONE SEEN    MAU Course  Procedures  Assessment and Plan  1. Single living IUP at [redacted]w[redacted]d - Confirmed by ultrasound - Positive HCG - Return to MAU as needed   Lorna Dibble 02/19/2013, 10:09 AM

## 2013-02-19 NOTE — MAU Note (Signed)
Pt states she wants accurate pregnancy test, hx of ectopic pregnancy last year.  Hasn't done HPT, is late for period.  No pain or bleeding today.

## 2013-02-19 NOTE — MAU Note (Signed)
Name and DOB verified; pt confirmed correct spelling on IDband. 

## 2013-02-19 NOTE — Discharge Instructions (Signed)
Pregnancy - First Trimester During sexual intercourse, millions of sperm go into the vagina. Only 1 sperm will penetrate and fertilize the female egg while it is in the Fallopian tube. One week later, the fertilized egg implants into the wall of the uterus. An embryo begins to develop into a baby. At 6 to 8 weeks, the eyes and face are formed and the heartbeat can be seen on ultrasound. At the end of 12 weeks (first trimester), all the baby's organs are formed. Now that you are pregnant, you will want to do everything you can to have a healthy baby. Two of the most important things are to get good prenatal care and follow your caregiver's instructions. Prenatal care is all the medical care you receive before the baby's birth. It is given to prevent, find, and treat problems during the pregnancy and childbirth. PRENATAL EXAMS  During prenatal visits, your weight, blood pressure and urine are checked. This is done to make sure you are healthy and progressing normally during the pregnancy.  A pregnant woman should gain 25 to 35 pounds during the pregnancy. However, if you are over weight or underweight, your caregiver will advise you regarding your weight.  Your caregiver will ask and answer questions for you.  Blood work, cervical cultures, other necessary tests and a Pap test are done during your prenatal exams. These tests are done to check on your health and the probable health of your baby. Tests are strongly recommended and done for HIV with your permission. This is the virus that causes AIDS. These tests are done because medications can be given to help prevent your baby from being born with this infection should you have been infected without knowing it. Blood work is also used to find out your blood type, previous infections and follow your blood levels (hemoglobin).  Low hemoglobin (anemia) is common during pregnancy. Iron and vitamins are given to help prevent this. Later in the pregnancy, blood  tests for diabetes will be done along with any other tests if any problems develop. You may need tests to make sure you and the baby are doing well.  You may need other tests to make sure you and the baby are doing well. CHANGES DURING THE FIRST TRIMESTER (THE FIRST 3 MONTHS OF PREGNANCY) Your body goes through many changes during pregnancy. They vary from person to person. Talk to your caregiver about changes you notice and are concerned about. Changes can include:  Your menstrual period stops.  The egg and sperm carry the genes that determine what you look like. Genes from you and your partner are forming a baby. The female genes determine whether the baby is a boy or a girl.  Your body increases in girth and you may feel bloated.  Feeling sick to your stomach (nauseous) and throwing up (vomiting). If the vomiting is uncontrollable, call your caregiver.  Your breasts will begin to enlarge and become tender.  Your nipples may stick out more and become darker.  The need to urinate more. Painful urination may mean you have a bladder infection.  Tiring easily.  Loss of appetite.  Cravings for certain kinds of food.  At first, you may gain or lose a couple of pounds.  You may have changes in your emotions from day to day (excited to be pregnant or concerned something may go wrong with the pregnancy and baby).  You may have more vivid and strange dreams. HOME CARE INSTRUCTIONS   It is very important   to avoid all smoking, alcohol and un-prescribed drugs during your pregnancy. These affect the formation and growth of the baby. Avoid chemicals while pregnant to ensure the delivery of a healthy infant.  Start your prenatal visits by the 12th week of pregnancy. They are usually scheduled monthly at first, then more often in the last 2 months before delivery. Keep your caregiver's appointments. Follow your caregiver's instructions regarding medication use, blood and lab tests, exercise, and  diet.  During pregnancy, you are providing food for you and your baby. Eat regular, well-balanced meals. Choose foods such as meat, fish, milk and other low fat dairy products, vegetables, fruits, and whole-grain breads and cereals. Your caregiver will tell you of the ideal weight gain.  You can help morning sickness by keeping soda crackers at the bedside. Eat a couple before arising in the morning. You may want to use the crackers without salt on them.  Eating 4 to 5 small meals rather than 3 large meals a day also may help the nausea and vomiting.  Drinking liquids between meals instead of during meals also seems to help nausea and vomiting.  A physical sexual relationship may be continued throughout pregnancy if there are no other problems. Problems may be early (premature) leaking of amniotic fluid from the membranes, vaginal bleeding, or belly (abdominal) pain.  Exercise regularly if there are no restrictions. Check with your caregiver or physical therapist if you are unsure of the safety of some of your exercises. Greater weight gain will occur in the last 2 trimesters of pregnancy. Exercising will help:  Control your weight.  Keep you in shape.  Prepare you for labor and delivery.  Help you lose your pregnancy weight after you deliver your baby.  Wear a good support or jogging bra for breast tenderness during pregnancy. This may help if worn during sleep too.  Ask when prenatal classes are available. Begin classes when they are offered.  Do not use hot tubs, steam rooms or saunas.  Wear your seat belt when driving. This protects you and your baby if you are in an accident.  Avoid raw meat, uncooked cheese, cat litter boxes and soil used by cats throughout the pregnancy. These carry germs that can cause birth defects in the baby.  The first trimester is a good time to visit your dentist for your dental health. Getting your teeth cleaned is OK. Use a softer toothbrush and brush  gently during pregnancy.  Ask for help if you have financial, counseling or nutritional needs during pregnancy. Your caregiver will be able to offer counseling for these needs as well as refer you for other special needs.  Do not take any medications or herbs unless told by your caregiver.  Inform your caregiver if there is any mental or physical domestic violence.  Make a list of emergency phone numbers of family, friends, hospital, and police and fire departments.  Write down your questions. Take them to your prenatal visit.  Do not douche.  Do not cross your legs.  If you have to stand for long periods of time, rotate you feet or take small steps in a circle.  You may have more vaginal secretions that may require a sanitary pad. Do not use tampons or scented sanitary pads. MEDICATIONS AND DRUG USE IN PREGNANCY  Take prenatal vitamins as directed. The vitamin should contain 1 milligram of folic acid. Keep all vitamins out of reach of children. Only a couple vitamins or tablets containing iron may be   fatal to a baby or young child when ingested.  Avoid use of all medications, including herbs, over-the-counter medications, not prescribed or suggested by your caregiver. Only take over-the-counter or prescription medicines for pain, discomfort, or fever as directed by your caregiver. Do not use aspirin, ibuprofen, or naproxen unless directed by your caregiver.  Let your caregiver also know about herbs you may be using.  Alcohol is related to a number of birth defects. This includes fetal alcohol syndrome. All alcohol, in any form, should be avoided completely. Smoking will cause low birth rate and premature babies.  Street or illegal drugs are very harmful to the baby. They are absolutely forbidden. A baby born to an addicted mother will be addicted at birth. The baby will go through the same withdrawal an adult does.  Let your caregiver know about any medications that you have to take  and for what reason you take them. MISCARRIAGE IS COMMON DURING PREGNANCY A miscarriage does not mean you did something wrong. It is not a reason to worry about getting pregnant again. Your caregiver will help you with questions you may have. If you have a miscarriage, you may need minor surgery. SEEK MEDICAL CARE IF:  You have any concerns or worries during your pregnancy. It is better to call with your questions if you feel they cannot wait, rather than worry about them. SEEK IMMEDIATE MEDICAL CARE IF:   An unexplained oral temperature above 102 F (38.9 C) develops, or as your caregiver suggests.  You have leaking of fluid from the vagina (birth canal). If leaking membranes are suspected, take your temperature and inform your caregiver of this when you call.  There is vaginal spotting or bleeding. Notify your caregiver of the amount and how many pads are used.  You develop a bad smelling vaginal discharge with a change in the color.  You continue to feel sick to your stomach (nauseated) and have no relief from remedies suggested. You vomit blood or coffee ground-like materials.  You lose more than 2 pounds of weight in 1 week.  You gain more than 2 pounds of weight in 1 week and you notice swelling of your face, hands, feet, or legs.  You gain 5 pounds or more in 1 week (even if you do not have swelling of your hands, face, legs, or feet).  You get exposed to German measles and have never had them.  You are exposed to fifth disease or chickenpox.  You develop belly (abdominal) pain. Round ligament discomfort is a common non-cancerous (benign) cause of abdominal pain in pregnancy. Your caregiver still must evaluate this.  You develop headache, fever, diarrhea, pain with urination, or shortness of breath.  You fall or are in a car accident or have any kind of trauma.  There is mental or physical violence in your home. Document Released: 11/22/2001 Document Revised: 02/20/2012  Document Reviewed: 05/26/2009 [employer] Patient Information 2013 [employer].  

## 2013-02-19 NOTE — MAU Note (Signed)
Pt denies any pain today, but states she had lower abd pain last week, had to stay in bed.

## 2013-02-20 LAB — GC/CHLAMYDIA PROBE AMP
CT Probe RNA: NEGATIVE
GC Probe RNA: NEGATIVE

## 2013-02-21 NOTE — MAU Provider Note (Signed)

## 2013-09-10 ENCOUNTER — Encounter (HOSPITAL_COMMUNITY): Payer: Self-pay

## 2013-09-10 ENCOUNTER — Inpatient Hospital Stay (HOSPITAL_COMMUNITY)
Admission: AD | Admit: 2013-09-10 | Discharge: 2013-09-10 | Disposition: A | Discharge status: Home / Self Care | Payer: Medicaid Other | Source: Ambulatory Visit | Attending: Obstetrics and Gynecology | Admitting: Obstetrics and Gynecology

## 2013-09-10 DIAGNOSIS — B373 Candidiasis of vulva and vagina: Secondary | ICD-10-CM

## 2013-09-10 DIAGNOSIS — L293 Anogenital pruritus, unspecified: Secondary | ICD-10-CM | POA: Insufficient documentation

## 2013-09-10 DIAGNOSIS — N949 Unspecified condition associated with female genital organs and menstrual cycle: Secondary | ICD-10-CM | POA: Insufficient documentation

## 2013-09-10 DIAGNOSIS — B3731 Acute candidiasis of vulva and vagina: Secondary | ICD-10-CM

## 2013-09-10 LAB — URINALYSIS, ROUTINE W REFLEX MICROSCOPIC
Bilirubin Urine: NEGATIVE
Ketones, ur: NEGATIVE mg/dL
Nitrite: NEGATIVE
Protein, ur: NEGATIVE mg/dL
Specific Gravity, Urine: >1.03 — ABNORMAL HIGH (ref 1.005–1.030)
Urobilinogen, UA: 0.2 mg/dL (ref 0.0–1.0)

## 2013-09-10 LAB — URINE MICROSCOPIC-ADD ON

## 2013-09-10 LAB — WET PREP, GENITAL: Trich, Wet Prep: NONE SEEN

## 2013-09-10 MED ORDER — FLUCONAZOLE 150 MG PO TABS
150.0000 mg | ORAL_TABLET | Freq: Once | ORAL | Status: AC
  Administered 2013-09-10: 150 mg via ORAL
  Filled 2013-09-10: qty 1

## 2013-09-10 MED ORDER — FLUCONAZOLE 150 MG PO TABS: 150.0000 mg | ORAL_TABLET | Freq: Once | ORAL | Status: DC

## 2013-09-10 NOTE — MAU Provider Note (Signed)
Attestation of Attending Supervision of Advanced Practitioner (CNM/NP): Evaluation and management procedures were performed by the Advanced Practitioner under my supervision and collaboration.  I have reviewed the Advanced Practitioner's note and chart, and I agree with the management and plan.  Jasman Pfeifle 09/10/2013 5:06 PM   

## 2013-09-10 NOTE — MAU Note (Signed)
Patient states she has had a vaginal itching since using the Nuvaring. Denies pain or discharge.

## 2013-09-10 NOTE — MAU Provider Note (Signed)
History     CSN: [id number]  Arrival date and time: 09/10/13 7829   First Provider Initiated Contact with Patient 09/10/13 0957      No chief complaint on file.  HPI  [name] is a 27 y.o. non-pregnant female; [phone] who presents with vaginal irritation; possible infection. She put her nuva ring in on the 13th of September and started feeling weird; she started experiencing vaginal itching and burning. She denies urinary symptoms. She does not have a Dr. And comes to MAU when she has a concern.   OB History   Grav Para Term Preterm Abortions TAB SAB Ect Mult Living   6 3 3  0 3 2 0 1 0 3      Past Medical History  Diagnosis Date  . Genital herpes   . Bacterial vaginosis   . Ectopic pregnancy   . Chlamydia   . Trichomonas   . Abnormal Pap smear     follow up was wnl    Past Surgical History  Procedure Laterality Date  . Dilation and curettage of uterus    . Laparoscopy  01/29/2012    Procedure: LAPAROSCOPY OPERATIVE;  Surgeon: Hollie Salk C. Marice Potter, MD;  Location: [facility];  Service: Gynecology;  Laterality: N/A;  operative laparoscopy. Right salpingectomy with removal of ectopic pregnancy    Family History  Problem Relation Age of Onset  . Hypertension Maternal Grandmother   . Diabetes Maternal Grandmother   . Anesthesia problems Neg Hx   . Other Neg Hx   . Migraines Sister     History  Substance Use Topics  . Smoking status: Current Every Day Smoker -- 0.50 packs/day for 5 years    Types: Cigarettes  . Smokeless tobacco: Never Used  . Alcohol Use: Yes     Comment: occasionally- social    Allergies: No Known Allergies  No prescriptions prior to admission    Results for orders placed during the hospital encounter of 09/10/13 (from the past 24 hour(s))  URINALYSIS, ROUTINE W REFLEX MICROSCOPIC     Status: Abnormal   Collection Time    09/10/13  9:41 AM      Result Value Range   Color, Urine YELLOW  YELLOW   APPearance CLEAR  CLEAR   Specific  Gravity, Urine >1.030 (*) 1.005 - 1.030   pH 6.0  5.0 - 8.0   Glucose, UA NEGATIVE  NEGATIVE mg/dL   Hgb urine dipstick SMALL (*) NEGATIVE   Bilirubin Urine NEGATIVE  NEGATIVE   Ketones, ur NEGATIVE  NEGATIVE mg/dL   Protein, ur NEGATIVE  NEGATIVE mg/dL   Urobilinogen, UA 0.2  0.0 - 1.0 mg/dL   Nitrite NEGATIVE  NEGATIVE   Leukocytes, UA TRACE (*) NEGATIVE  URINE MICROSCOPIC-ADD ON     Status: Abnormal   Collection Time    09/10/13  9:41 AM      Result Value Range   Squamous Epithelial / LPF FEW (*) RARE   WBC, UA 0-2  <3 WBC/hpf   RBC / HPF 0-2  <3 RBC/hpf   Bacteria, UA FEW (*) RARE   Urine-Other MUCOUS PRESENT    WET PREP, GENITAL     Status: Abnormal   Collection Time    09/10/13 10:10 AM      Result Value Range   Yeast Wet Prep HPF POC FEW (*) NONE SEEN   Trich, Wet Prep NONE SEEN  NONE SEEN   Clue Cells Wet Prep HPF POC NONE SEEN  NONE SEEN  WBC, Wet Prep HPF POC FEW (*) NONE SEEN   Review of Systems  Constitutional: Negative for fever and chills.  Gastrointestinal: Negative for nausea, vomiting, abdominal pain, diarrhea and constipation.  Genitourinary: Negative for dysuria, urgency, frequency and hematuria.       + itching and burning around vagina.  Negative for vaginal bleeding   Neurological: Negative for headaches.   Physical Exam   Height 5' 5.5" (1.664 m), weight 105.098 kg (231 lb 11.2 oz), last menstrual period 08/20/2013, unknown if currently breastfeeding.  Physical Exam  Constitutional: She is oriented to person, place, and time. She appears well-developed and well-nourished. No distress.  Neck: Neck supple.  Respiratory: Effort normal.  GI: Soft. She exhibits no distension. There is no tenderness. There is no rebound and no guarding.  Genitourinary: Vaginal discharge found.  Speculum exam: Vagina - Small amount of thick white discharge, no odor Cervix - scant contact bleeding, thick-curd like discharge around cervix  Bimanual exam: Cervix  closed Uterus non tender, normal size Adnexa non tender, no masses bilaterally GC/Chlam, wet prep done Chaperone present for exam.   Musculoskeletal: Normal range of motion.  Neurological: She is alert and oriented to person, place, and time.  Skin: Skin is warm and dry. She is not diaphoretic.    MAU Course  Procedures None  MDM UA UPT Wet prep GC/Chlamydia Diflucan 150 mg PO in MAU   Assessment and Plan  A: Yeast vaginitis   P: Discharge home Diflucan 150 mg PO in MAU Return to MAU if symptoms worsen Referral made to GYN clinic for birth control management  Continue Nuva Ring   RASCH, JENNIFER IRENE FNP-C 09/10/2013, 10:39 AM

## 2013-09-11 LAB — GC/CHLAMYDIA PROBE AMP
CT Probe RNA: NEGATIVE
GC Probe RNA: NEGATIVE

## 2013-09-13 ENCOUNTER — Encounter: Payer: Self-pay | Admitting: Nurse Practitioner

## 2013-10-24 ENCOUNTER — Ambulatory Visit: Payer: Medicaid Other | Admitting: Nurse Practitioner

## 2013-12-04 ENCOUNTER — Inpatient Hospital Stay (HOSPITAL_COMMUNITY)
Admission: AD | Admit: 2013-12-04 | Discharge: 2013-12-04 | Disposition: A | Discharge status: Home / Self Care | Payer: Medicaid Other | Source: Ambulatory Visit | Attending: Obstetrics & Gynecology | Admitting: Obstetrics & Gynecology

## 2013-12-04 ENCOUNTER — Encounter (HOSPITAL_COMMUNITY): Payer: Self-pay | Admitting: *Deleted

## 2013-12-04 DIAGNOSIS — B373 Candidiasis of vulva and vagina: Secondary | ICD-10-CM

## 2013-12-04 DIAGNOSIS — A599 Trichomoniasis, unspecified: Secondary | ICD-10-CM

## 2013-12-04 DIAGNOSIS — F17200 Nicotine dependence, unspecified, uncomplicated: Secondary | ICD-10-CM | POA: Insufficient documentation

## 2013-12-04 DIAGNOSIS — A5901 Trichomonal vulvovaginitis: Secondary | ICD-10-CM | POA: Insufficient documentation

## 2013-12-04 DIAGNOSIS — B3731 Acute candidiasis of vulva and vagina: Secondary | ICD-10-CM | POA: Insufficient documentation

## 2013-12-04 DIAGNOSIS — L293 Anogenital pruritus, unspecified: Secondary | ICD-10-CM | POA: Insufficient documentation

## 2013-12-04 DIAGNOSIS — A6000 Herpesviral infection of urogenital system, unspecified: Secondary | ICD-10-CM | POA: Insufficient documentation

## 2013-12-04 LAB — URINALYSIS, ROUTINE W REFLEX MICROSCOPIC
Bilirubin Urine: NEGATIVE
Ketones, ur: NEGATIVE mg/dL
Protein, ur: NEGATIVE mg/dL
Urobilinogen, UA: 0.2 mg/dL (ref 0.0–1.0)
pH: 5.5 (ref 5.0–8.0)

## 2013-12-04 LAB — WET PREP, GENITAL

## 2013-12-04 LAB — URINE MICROSCOPIC-ADD ON

## 2013-12-04 MED ORDER — FLUCONAZOLE 150 MG PO TABS: 150.0000 mg | ORAL_TABLET | Freq: Every day | ORAL | Status: DC

## 2013-12-04 MED ORDER — VALACYCLOVIR HCL 500 MG PO TABS: 500.0000 mg | ORAL_TABLET | Freq: Two times a day (BID) | ORAL | Status: DC

## 2013-12-04 MED ORDER — FLUCONAZOLE 150 MG PO TABS
150.0000 mg | ORAL_TABLET | Freq: Every day | ORAL | Status: DC
  Administered 2013-12-04: 150 mg via ORAL
  Filled 2013-12-04: qty 1

## 2013-12-04 MED ORDER — METRONIDAZOLE 500 MG PO TABS
2000.0000 mg | ORAL_TABLET | Freq: Once | ORAL | Status: AC
  Administered 2013-12-04: 2000 mg via ORAL
  Filled 2013-12-04: qty 4

## 2013-12-04 NOTE — MAU Note (Signed)
Been having a problem with nuvaring, seems like get an itch every time she puts it in.

## 2013-12-04 NOTE — MAU Provider Note (Signed)
History     CSN: [id number]  Arrival date and time: 12/04/13 1340   First Provider Initiated Contact with Patient 12/04/13 1556      Chief Complaint  Patient presents with  . Vaginal Itching   HPI  Jodi Estrada is a 27 y.o. [phone] non-pregnant female who presents with vaginal irritation and vaginal itching. For the past few days she has been very uncomfortable in her vagina area and feels it is due to her nuva ring. She feels she has a yeast infection. After very much discussion, patient states she was diagnosed with HSV; last outbreak was 2010. Pt was very upset and crying during the discussion. No new partners recently.   OB History   Grav Para Term Preterm Abortions TAB SAB Ect Mult Living   6 3 3  0 3 2 0 1 0 3      Past Medical History  Diagnosis Date  . Genital herpes   . Bacterial vaginosis   . Ectopic pregnancy   . Chlamydia   . Trichomonas   . Abnormal Pap smear     follow up was wnl    Past Surgical History  Procedure Laterality Date  . Dilation and curettage of uterus    . Laparoscopy  01/29/2012    Procedure: LAPAROSCOPY OPERATIVE;  Surgeon: Hollie Salk C. Marice Potter, MD;  Location: [facility];  Service: Gynecology;  Laterality: N/A;  operative laparoscopy. Right salpingectomy with removal of ectopic pregnancy    Family History  Problem Relation Age of Onset  . Hypertension Maternal Grandmother   . Diabetes Maternal Grandmother   . Anesthesia problems Neg Hx   . Other Neg Hx   . Migraines Sister     History  Substance Use Topics  . Smoking status: Current Every Day Smoker -- 0.50 packs/day for 5 years    Types: Cigarettes  . Smokeless tobacco: Never Used  . Alcohol Use: Yes     Comment: occasionally- social    Allergies: No Known Allergies  Prescriptions prior to admission  Medication Sig Dispense Refill  . etonogestrel-ethinyl estradiol (NUVARING) 0.12-0.015 MG/24HR vaginal ring Place 1 each vaginally every 28 (twenty-eight) days. Insert vaginally  and leave in place for 3 consecutive weeks, then remove for 1 week.      Marland Kitchen guaiFENesin (MUCINEX) 600 MG 12 hr tablet Take 1,200 mg by mouth 2 (two) times daily as needed for to loosen phlegm.      Marland Kitchen ibuprofen (ADVIL,MOTRIN) 200 MG tablet Take 200 mg by mouth every 6 (six) hours as needed for mild pain.       Results for orders placed during the hospital encounter of 12/04/13 (from the past 24 hour(s))  WET PREP, GENITAL     Status: Abnormal   Collection Time    12/04/13  3:50 PM      Result Value Range   Yeast Wet Prep HPF POC MODERATE (*) NONE SEEN   Trich, Wet Prep FEW (*) NONE SEEN   Clue Cells Wet Prep HPF POC NONE SEEN  NONE SEEN   WBC, Wet Prep HPF POC MODERATE (*) NONE SEEN  URINALYSIS, ROUTINE W REFLEX MICROSCOPIC     Status: Abnormal   Collection Time    12/04/13  4:20 PM      Result Value Range   Color, Urine YELLOW  YELLOW   APPearance CLEAR  CLEAR   Specific Gravity, Urine 1.025  1.005 - 1.030   pH 5.5  5.0 - 8.0   Glucose, UA  NEGATIVE  NEGATIVE mg/dL   Hgb urine dipstick SMALL (*) NEGATIVE   Bilirubin Urine NEGATIVE  NEGATIVE   Ketones, ur NEGATIVE  NEGATIVE mg/dL   Protein, ur NEGATIVE  NEGATIVE mg/dL   Urobilinogen, UA 0.2  0.0 - 1.0 mg/dL   Nitrite NEGATIVE  NEGATIVE   Leukocytes, UA NEGATIVE  NEGATIVE  URINE MICROSCOPIC-ADD ON     Status: None   Collection Time    12/04/13  4:20 PM      Result Value Range   Squamous Epithelial / LPF RARE  RARE   WBC, UA 3-6  <3 WBC/hpf   RBC / HPF 3-6  <3 RBC/hpf   Bacteria, UA RARE  RARE  POCT PREGNANCY, URINE     Status: None   Collection Time    12/04/13  4:24 PM      Result Value Range   Preg Test, Ur NEGATIVE  NEGATIVE    Review of Systems  Constitutional: Negative for fever and chills.  Gastrointestinal: Negative for nausea, vomiting and abdominal pain.  Genitourinary: Negative for dysuria, urgency, frequency, hematuria and flank pain.       + vaginal itching + vaginal irritation  No active lesions     Physical Exam   Blood pressure 115/75, pulse 96, temperature 99.6 F (37.6 C), temperature source Oral, resp. rate 18, height 5\' 4"  (1.626 m), weight 109.77 kg (242 lb), last menstrual period 11/11/2013.  Physical Exam  Constitutional: She is oriented to person, place, and time. She appears well-developed and well-nourished. No distress.  HENT:  Head: Normocephalic.  Eyes: Pupils are equal, round, and reactive to light.  Neck: Neck supple.  Respiratory: Effort normal.  GI: Soft. She exhibits no distension. There is no tenderness. There is no rebound and no guarding.  Genitourinary: Cervix exhibits friability. Cervix exhibits no motion tenderness.    Speculum exam: Vagina - Small amount of creamy discharge, no odor Cervix - scant contact bleeding, erythema noted, multiple erythematous clusters, blister like lesions on cervix Bimanual exam: Cervix closed Uterus non tender, normal size Adnexa non tender, no masses bilaterally GC/Chlam, wet prep done Chaperone present for exam.   Musculoskeletal: Normal range of motion.  Neurological: She is alert and oriented to person, place, and time.  Skin: Skin is warm. She is not diaphoretic.  Psychiatric: Her behavior is normal.    MAU Course  Procedures None  MDM Diflucan 150 mg PO in MAU 2,000 mg of flagyl in MAU for treatment of trichomonas   Assessment and Plan   A: Yeast vaginitis Trichomonas  Recurrent genital herpes   P: Discharge home RX: Diflucan         Valtrex  Return to MAU as needed, if symptoms worsen You have been diagnosed with a sexually transmitted disease.  You have been treated and your partner will need to be treated.  No sex until 10 days after you finished your medicine and no sex until 10 days after your partner has taken their medication. Your partner can go to the [facility] department for treatment and testing.  Support given   Jodi Estrada 12/04/2013, 3:59 PM

## 2013-12-04 NOTE — Progress Notes (Signed)
Perineum appears red and irritated

## 2013-12-04 NOTE — MAU Note (Signed)
Pt states she started having vaginal itching. Pt states she "placed her nuva ring in a day and a half ago." Pt states she tried a bathe after placing it but was so irritated that she had to remove it

## 2013-12-05 LAB — GC/CHLAMYDIA PROBE AMP
CT Probe RNA: NEGATIVE
GC Probe RNA: NEGATIVE

## 2013-12-06 NOTE — MAU Provider Note (Signed)
Attestation of Attending Supervision of Advanced Practitioner (CNM/NP): Evaluation and management procedures were performed by the Advanced Practitioner under my supervision and collaboration.  I have reviewed the Advanced Practitioner's note and chart, and I agree with the management and plan.  HARRAWAY-SMITH, Kylin Dubs 11:01 AM     

## 2013-12-19 ENCOUNTER — Inpatient Hospital Stay (EMERGENCY_DEPARTMENT_HOSPITAL)
Admission: AD | Admit: 2013-12-19 | Discharge: 2013-12-19 | Disposition: A | Discharge status: Home / Self Care | Payer: Medicaid Other | Source: Ambulatory Visit | Attending: Obstetrics & Gynecology | Admitting: Obstetrics & Gynecology

## 2013-12-19 ENCOUNTER — Inpatient Hospital Stay (HOSPITAL_COMMUNITY)
Admission: AD | Admit: 2013-12-19 | Discharge: 2013-12-19 | Discharge status: Against Medical Advice | Payer: Medicaid Other | Source: Ambulatory Visit | Attending: Obstetrics & Gynecology | Admitting: Obstetrics & Gynecology

## 2013-12-19 ENCOUNTER — Encounter (HOSPITAL_COMMUNITY): Payer: Self-pay | Admitting: General Practice

## 2013-12-19 DIAGNOSIS — F17200 Nicotine dependence, unspecified, uncomplicated: Secondary | ICD-10-CM | POA: Insufficient documentation

## 2013-12-19 DIAGNOSIS — A5901 Trichomonal vulvovaginitis: Secondary | ICD-10-CM

## 2013-12-19 DIAGNOSIS — L293 Anogenital pruritus, unspecified: Secondary | ICD-10-CM | POA: Insufficient documentation

## 2013-12-19 DIAGNOSIS — A599 Trichomoniasis, unspecified: Secondary | ICD-10-CM

## 2013-12-19 LAB — URINALYSIS, ROUTINE W REFLEX MICROSCOPIC
BILIRUBIN URINE: NEGATIVE
GLUCOSE, UA: NEGATIVE mg/dL
KETONES UR: NEGATIVE mg/dL
Nitrite: NEGATIVE
PROTEIN: NEGATIVE mg/dL
Specific Gravity, Urine: >1.03 — ABNORMAL HIGH (ref 1.005–1.030)
UROBILINOGEN UA: 0.2 mg/dL (ref 0.0–1.0)
pH: 5.5 (ref 5.0–8.0)

## 2013-12-19 LAB — WET PREP, GENITAL
Clue Cells Wet Prep HPF POC: NONE SEEN
Yeast Wet Prep HPF POC: NONE SEEN

## 2013-12-19 LAB — URINE MICROSCOPIC-ADD ON

## 2013-12-19 LAB — POCT PREGNANCY, URINE: Preg Test, Ur: NEGATIVE

## 2013-12-19 MED ORDER — METRONIDAZOLE 500 MG PO TABS
2000.0000 mg | ORAL_TABLET | Freq: Once | ORAL | Status: AC
  Administered 2013-12-19: 2000 mg via ORAL
  Filled 2013-12-19: qty 4

## 2013-12-19 MED ORDER — FLUCONAZOLE 150 MG PO TABS: 150.0000 mg | ORAL_TABLET | Freq: Once | ORAL | Status: DC

## 2013-12-19 MED ORDER — FLUCONAZOLE 150 MG PO TABS
ORAL_TABLET | ORAL | Status: AC
  Filled 2013-12-19: qty 1

## 2013-12-19 NOTE — Discharge Instructions (Signed)
Trichomoniasis °Trichomoniasis is an infection, caused by the Trichomonas organism, that affects both women and men. In women, the outer female genitalia and the vagina are affected. In men, the penis is mainly affected, but the prostate and other reproductive organs can also be involved. Trichomoniasis is a sexually transmitted disease (STD) and is most often passed to another person through sexual contact. The majority of people who get trichomoniasis do so from a sexual encounter and are also at risk for other STDs. °CAUSES  °· Sexual intercourse with an infected partner. °· It can be present in swimming pools or hot tubs. °SYMPTOMS  °· Abnormal gray-green frothy vaginal discharge in women. °· Vaginal itching and irritation in women. °· Itching and irritation of the area outside the vagina in women. °· Penile discharge with or without pain in males. °· Inflammation of the urethra (urethritis), causing painful urination. °· Bleeding after sexual intercourse. °RELATED COMPLICATIONS °· Pelvic inflammatory disease. °· Infection of the uterus (endometritis). °· Infertility. °· Tubal (ectopic) pregnancy. °· It can be associated with other STDs, including gonorrhea and chlamydia, hepatitis B, and HIV. °COMPLICATIONS DURING PREGNANCY °· Early (premature) delivery. °· Premature rupture of the membranes (PROM). °· Low birth weight. °DIAGNOSIS  °· Visualization of Trichomonas under the microscope from the vagina discharge. °· Ph of the vagina greater than 4.5, tested with a test tape. °· Trich Rapid Test. °· Culture of the organism, but this is not usually needed. °· It may be found on a Pap test. °· Having a "strawberry cervix,"which means the cervix looks very red like a strawberry. °TREATMENT  °· You may be given medication to fight the infection. Inform your caregiver if you could be or are pregnant. Some medications used to treat the infection should not be taken during pregnancy. °· Over-the-counter medications or  creams to decrease itching or irritation may be recommended. °· Your sexual partner will need to be treated if infected. °HOME CARE INSTRUCTIONS  °· Take all medication prescribed by your caregiver. °· Take over-the-counter medication for itching or irritation as directed by your caregiver. °· Do not have sexual intercourse while you have the infection. °· Do not douche or wear tampons. °· Discuss your infection with your partner, as your partner may have acquired the infection from you. Or, your partner may have been the person who transmitted the infection to you. °· Have your sex partner examined and treated if necessary. °· Practice safe, informed, and protected sex. °· See your caregiver for other STD testing. °SEEK MEDICAL CARE IF:  °· You still have symptoms after you finish the medication. °· You have an oral temperature above 102° F (38.9° C). °· You develop belly (abdominal) pain. °· You have pain when you urinate. °· You have bleeding after sexual intercourse. °· You develop a rash. °· The medication makes you sick or makes you throw up (vomit). °Document Released: 05/24/2001 Document Revised: 02/20/2012 Document Reviewed: 06/19/2009 °ExitCare® Patient Information ©2014 [employer]. ° °

## 2013-12-19 NOTE — MAU Note (Signed)
Not in the lobby 

## 2013-12-19 NOTE — MAU Note (Signed)
Pt here for vag irritation. Seen 2 weeks ago and treated for trichomonas and yeast. Pt checked in earlier today and left  And now is back to be seen and evaluated.

## 2013-12-19 NOTE — MAU Note (Signed)
Pt reports she was treated for trichomonas and yeast 2 weeks ago . Still having vag itching and irritation. Stated she has not had intercourse since treatment.

## 2013-12-19 NOTE — MAU Provider Note (Signed)
Attestation of Attending Supervision of Advanced Practitioner (CNM/NP): Evaluation and management procedures were performed by the Advanced Practitioner under my supervision and collaboration.  I have reviewed the Advanced Practitioner's note and chart, and I agree with the management and plan.  HARRAWAY-SMITH, Toia Micale 10:47 PM     

## 2013-12-19 NOTE — MAU Provider Note (Signed)
History     CSN: [id number]  Arrival date and time: 12/19/13 1743   First Provider Initiated Contact with Patient 12/19/13 1917      No chief complaint on file.  HPI  Jodi Estrada is a 28 y.o. [phone] who presents today with continued vulvar irritation and itching. She states that she was treated for trich and yeast on 12/24//14, and her symptoms never went away.   Past Medical History  Diagnosis Date  . Genital herpes   . Bacterial vaginosis   . Ectopic pregnancy   . Chlamydia   . Trichomonas   . Abnormal Pap smear     follow up was wnl    Past Surgical History  Procedure Laterality Date  . Dilation and curettage of uterus    . Laparoscopy  01/29/2012    Procedure: LAPAROSCOPY OPERATIVE;  Surgeon: Hollie SalkMyra C. Marice Potterove, MD;  Location: [facility];  Service: Gynecology;  Laterality: N/A;  operative laparoscopy. Right salpingectomy with removal of ectopic pregnancy    Family History  Problem Relation Age of Onset  . Hypertension Maternal Grandmother   . Diabetes Maternal Grandmother   . Anesthesia problems Neg Hx   . Other Neg Hx   . Migraines Sister     History  Substance Use Topics  . Smoking status: Current Every Day Smoker -- 0.50 packs/day for 5 years    Types: Cigarettes  . Smokeless tobacco: Never Used  . Alcohol Use: Yes     Comment: occasionally- social    Allergies: No Known Allergies  Prescriptions prior to admission  Medication Sig Dispense Refill  . etonogestrel-ethinyl estradiol (NUVARING) 0.12-0.015 MG/24HR vaginal ring Place 1 each vaginally every 28 (twenty-eight) days. Insert vaginally and leave in place for 3 consecutive weeks, then remove for 1 week.      Marland Kitchen. guaiFENesin (MUCINEX) 600 MG 12 hr tablet Take 1,200 mg by mouth 2 (two) times daily as needed for to loosen phlegm.      Marland Kitchen. ibuprofen (ADVIL,MOTRIN) 200 MG tablet Take 200 mg by mouth every 6 (six) hours as needed for mild pain.      . valACYclovir (VALTREX) 500 MG tablet Take 1 tablet (500 mg  total) by mouth 2 (two) times daily.  6 tablet  1    ROS Physical Exam   Blood pressure 146/79, pulse 99, temperature 98.3 F (36.8 C), temperature source Oral, resp. rate 18, last menstrual period 11/11/2013.  Physical Exam  Nursing note and vitals reviewed. Constitutional: She is oriented to person, place, and time. She appears well-developed and well-nourished. No distress.  Cardiovascular: Normal rate.   Respiratory: Effort normal.  GI: Soft. There is no tenderness.  Genitourinary:   External: no lesion Vagina: moderate amount of frothy green/yellow discharge.  Cervix: pink, smooth, no CMT Uterus: NSSC Adnexa: NT   Neurological: She is alert and oriented to person, place, and time.  Skin: Skin is warm and dry.  Psychiatric: She has a normal mood and affect.    MAU Course  Procedures  Results for orders placed during the hospital encounter of 12/19/13 (from the past 24 hour(s))  WET PREP, GENITAL     Status: Abnormal   Collection Time    12/19/13  7:20 PM      Result Value Range   Yeast Wet Prep HPF POC NONE SEEN  NONE SEEN   Trich, Wet Prep MODERATE (*) NONE SEEN   Clue Cells Wet Prep HPF POC NONE SEEN  NONE SEEN   WBC,  Wet Prep HPF POC MODERATE (*) NONE SEEN    Assessment and Plan   1. Trichomonas vaginalis infection    Treated here in MAU with 2g flagyl Given RX for diflucan if needed Return to MAU if symptoms persist  Tawnya Crook 12/19/2013, 7:37 PM

## 2013-12-19 NOTE — MAU Note (Signed)
Pt not in lobby.  

## 2013-12-19 NOTE — MAU Note (Signed)
Pt states she was here on 12/24 and was given medicine to treat trichomonas. Pt is back today stating that she continues to have a heavy vaginal discharge and it now has an odor. She would like to be rechecked.

## 2013-12-20 LAB — URINE CULTURE
COLONY COUNT: NO GROWTH
CULTURE: NO GROWTH

## 2014-01-30 ENCOUNTER — Encounter (HOSPITAL_COMMUNITY): Payer: Self-pay | Admitting: Emergency Medicine

## 2014-01-30 ENCOUNTER — Emergency Department (HOSPITAL_COMMUNITY)
Admission: EM | Admit: 2014-01-30 | Discharge: 2014-01-30 | Disposition: A | Discharge status: Home / Self Care | Payer: Medicaid Other | Source: Home / Self Care | Attending: Family Medicine | Admitting: Family Medicine

## 2014-01-30 DIAGNOSIS — H01009 Unspecified blepharitis unspecified eye, unspecified eyelid: Secondary | ICD-10-CM

## 2014-01-30 DIAGNOSIS — H01003 Unspecified blepharitis right eye, unspecified eyelid: Secondary | ICD-10-CM

## 2014-01-30 DIAGNOSIS — H109 Unspecified conjunctivitis: Secondary | ICD-10-CM

## 2014-01-30 MED ORDER — ERYTHROMYCIN 5 MG/GM OP OINT: TOPICAL_OINTMENT | OPHTHALMIC | Status: DC

## 2014-01-30 NOTE — ED Notes (Signed)
C/o tenderness in R upper eyelid onset 2 days ago.  When she woke she had white drainage in corner of her eye. Has a little redness in inner aspect of sclera.  Can't rub it because it is aching so bad.

## 2014-01-30 NOTE — Discharge Instructions (Signed)
Take the medication as directed. Warm compresses for 20 minutes 3 to 4 times a day.  Cleanse the right eye with baby shampoo 2 to 3 times a day.  If the eye symptoms worsen or do not improve over the next 48 hours please arrange follow up with the ophthalmologist referral provided.    Conjunctivitis Conjunctivitis is commonly called "pink eye." Conjunctivitis can be caused by bacterial or viral infection, allergies, or injuries. There is usually redness of the lining of the eye, itching, discomfort, and sometimes discharge. There may be deposits of matter along the eyelids. A viral infection usually causes a watery discharge, while a bacterial infection causes a yellowish, thick discharge. Pink eye is very contagious and spreads by direct contact. You may be given antibiotic eyedrops as part of your treatment. Before using your eye medicine, remove all drainage from the eye by washing gently with warm water and cotton balls. Continue to use the medication until you have awakened 2 mornings in a row without discharge from the eye. Do not rub your eye. This increases the irritation and helps spread infection. Use separate towels from other household members. Wash your hands with soap and water before and after touching your eyes. Use cold compresses to reduce pain and sunglasses to relieve irritation from light. Do not wear contact lenses or wear eye makeup until the infection is gone. SEEK MEDICAL CARE IF:   Your symptoms are not better after 3 days of treatment.  You have increased pain or trouble seeing.  The outer eyelids become very red or swollen. Document Released: 01/05/2005 Document Revised: 02/20/2012 Document Reviewed: 11/28/2005 [facility] Patient Information 2014 [place]Care, [place].  Blepharitis Blepharitis is a skin problem that makes your eyelids watery, red, puffy (swollen), crusty, scaly, or painful. It may also make your eyes itch. You may lose eyelashes. HOME CARE  Keep your hands  clean.  Use a clean towel each time you dry your eyelids. Do not share towels or makeup with anyone.  Carefully wash your eyelids and eyelashes 2 times a day. Use warm water and baby shampoo or just water.  Wash your face and eyebrows at least once a day.  Hold a folded washcloth under warm water. Squeeze the water out. Put the warm washcloth on your eyes 2 times a day for 10 minutes, or as told by your doctor.  Apply medicated cream as told by your doctor.  Avoid rubbing your eyes.  Avoid wearing makeup until you get better.  Follow up with your doctor as told. GET HELP RIGHT AWAY IF:  Your pain, redness, or puffiness gets worse.  Your pain, redness, or puffiness spreads to other parts of your face.  Your vision changes, or you have pain when looking at lights or moving objects.  You have a fever.  You do not get better after 2 to 4 days. MAKE SURE YOU:  Understand these instructions.  Will watch your condition.  Will get help right away if you are not doing well or get worse. Document Released: 09/06/2008 Document Revised: 02/20/2012 Document Reviewed: 01/05/2011 [facility] SummitExitCare Patient Information 2014 [place]ExitCare, [place]LLC.

## 2014-01-30 NOTE — ED Provider Notes (Signed)
CSN: [id number]     Arrival date & time 01/30/14  1854 History   First MD Initiated Contact with Patient 01/30/14 1954     Chief Complaint  Patient presents with  . Conjunctivitis      Patient is a 28 y.o. female presenting with eye problem. The history is provided by the patient.  Eye Problem Location:  R eye Quality:  Aching and stinging Severity:  Moderate Onset quality:  Gradual Duration:  2 days Timing:  Constant Progression:  Worsening Chronicity:  New Context: not burn, not chemical exposure, not contact lens problem, not direct trauma, not foreign body, not using machinery, not scratch, not smoke exposure and not tanning booth use   Relieved by:  None tried Worsened by:  Nothing tried Ineffective treatments:  None tried Associated symptoms: crusting, discharge, inflammation, redness, swelling and tearing   Associated symptoms: no blurred vision, no decreased vision, no double vision, no facial rash, no foreign body sensation, no headaches, no itching, no nausea, no numbness, no photophobia, no scotomas, no tingling, no vomiting and no weakness   Risk factors: no conjunctival hemorrhage, not exposed to pinkeye, no previous injury to eye, no recent herpes zoster and no recent URI     Past Medical History  Diagnosis Date  . Genital herpes   . Bacterial vaginosis   . Ectopic pregnancy   . Chlamydia   . Trichomonas   . Abnormal Pap smear     follow up was wnl   Past Surgical History  Procedure Laterality Date  . Dilation and curettage of uterus    . Laparoscopy  01/29/2012    Procedure: LAPAROSCOPY OPERATIVE;  Surgeon: Hollie Salk C. Marice Potter, MD;  Location: [facility];  Service: Gynecology;  Laterality: N/A;  operative laparoscopy. Right salpingectomy with removal of ectopic pregnancy   Family History  Problem Relation Age of Onset  . Hypertension Maternal Grandmother   . Diabetes Maternal Grandmother   . Anesthesia problems Neg Hx   . Other Neg Hx   . Migraines Sister     History  Substance Use Topics  . Smoking status: Current Every Day Smoker -- 0.50 packs/day for 5 years    Types: Cigarettes  . Smokeless tobacco: Never Used  . Alcohol Use: Yes     Comment: occasionally- social   OB History   Grav Para Term Preterm Abortions TAB SAB Ect Mult Living   6 3 3  0 3 2 0 1 0 3     Review of Systems  Constitutional: Negative.   Eyes: Positive for pain, discharge and redness. Negative for blurred vision, double vision, photophobia, itching and visual disturbance.  Gastrointestinal: Negative for nausea and vomiting.  Neurological: Negative for tingling, weakness, numbness and headaches.  All other systems reviewed and are negative.      Allergies  Review of patient's allergies indicates no known allergies.  Home Medications   Current Outpatient Rx  Name  Route  Sig  Dispense  Refill  . etonogestrel-ethinyl estradiol (NUVARING) 0.12-0.015 MG/24HR vaginal ring   Vaginal   Place 1 each vaginally every 28 (twenty-eight) days. Insert vaginally and leave in place for 3 consecutive weeks, then remove for 1 week.         . fluconazole (DIFLUCAN) 150 MG tablet   Oral   Take 1 tablet (150 mg total) by mouth once. May repeat in 2 days if still having symptoms.   2 tablet   0   . guaiFENesin (MUCINEX) 600 MG 12 hr  tablet   Oral   Take 1,200 mg by mouth 2 (two) times daily as needed for to loosen phlegm.         Marland Kitchen. ibuprofen (ADVIL,MOTRIN) 200 MG tablet   Oral   Take 200 mg by mouth every 6 (six) hours as needed for mild pain.         . valACYclovir (VALTREX) 500 MG tablet   Oral   Take 1 tablet (500 mg total) by mouth 2 (two) times daily.   6 tablet   1    BP 135/71  Pulse 81  Temp(Src) 98.1 F (36.7 C) (Oral)  Resp 18  SpO2 100%  LMP 01/12/2014 Physical Exam  Constitutional: She appears well-developed and well-nourished.  HENT:  Head: Normocephalic and atraumatic.  Eyes: EOM are normal. Pupils are equal, round, and reactive to  light. Right eye exhibits no discharge. Right conjunctiva is injected. Right conjunctiva has no hemorrhage. No scleral icterus.  Mild swelling, redness to upper lid on (R) eye w/ TTP. Mild conjunctival erythema (R) eye.  Neck: Neck supple.    ED Course  Procedures (including critical care time) Labs Review Labs Reviewed - No data to display Imaging Review No results found.    MDM   Final diagnoses:  None   2 day h/o redness, swelling and TTP to (R) upper eyelid w/ increased tearing and mild conjunctival erythema. Blepharitis w/ possible early conjunctivitis. Will treat w/ Erythromycin Opthalmic Ointment and provide instructions for home care/treatment of Blepharitis. Opthalmology referral provided and pt encouraged to arrange f/u if symptoms not improving or worsening after 24-48 hrs. Pt agreeable.    Leanne ChangKatherine P Salvadore Valvano, NP 02/01/14 1101

## 2014-02-10 NOTE — ED Provider Notes (Signed)
Medical screening examination/treatment/procedure(s) were performed by resident physician or non-physician practitioner and as supervising physician I was immediately available for consultation/collaboration.   Jakaila Norment DOUGLAS MD.   Mahasin Riviere D Kayce Betty, MD 02/10/14 1521 

## 2014-02-24 ENCOUNTER — Inpatient Hospital Stay (HOSPITAL_COMMUNITY)
Admission: AD | Admit: 2014-02-24 | Discharge: 2014-02-24 | Disposition: A | Discharge status: Home / Self Care | Payer: Medicaid Other | Source: Ambulatory Visit | Attending: Obstetrics and Gynecology | Admitting: Obstetrics and Gynecology

## 2014-02-24 ENCOUNTER — Encounter (HOSPITAL_COMMUNITY): Payer: Self-pay | Admitting: *Deleted

## 2014-02-24 DIAGNOSIS — L293 Anogenital pruritus, unspecified: Secondary | ICD-10-CM | POA: Insufficient documentation

## 2014-02-24 DIAGNOSIS — F17200 Nicotine dependence, unspecified, uncomplicated: Secondary | ICD-10-CM | POA: Insufficient documentation

## 2014-02-24 DIAGNOSIS — B3731 Acute candidiasis of vulva and vagina: Secondary | ICD-10-CM | POA: Insufficient documentation

## 2014-02-24 DIAGNOSIS — B373 Candidiasis of vulva and vagina: Secondary | ICD-10-CM

## 2014-02-24 LAB — URINALYSIS, ROUTINE W REFLEX MICROSCOPIC
Bilirubin Urine: NEGATIVE
Glucose, UA: NEGATIVE mg/dL
Ketones, ur: NEGATIVE mg/dL
Leukocytes, UA: NEGATIVE
Nitrite: NEGATIVE
Protein, ur: NEGATIVE mg/dL
Specific Gravity, Urine: >1.03 — ABNORMAL HIGH (ref 1.005–1.030)
Urobilinogen, UA: 0.2 mg/dL (ref 0.0–1.0)
pH: 5.5 (ref 5.0–8.0)

## 2014-02-24 LAB — URINE MICROSCOPIC-ADD ON

## 2014-02-24 LAB — POCT PREGNANCY, URINE: Preg Test, Ur: NEGATIVE

## 2014-02-24 LAB — WET PREP, GENITAL
Clue Cells Wet Prep HPF POC: NONE SEEN
Trich, Wet Prep: NONE SEEN

## 2014-02-24 MED ORDER — NYSTATIN-TRIAMCINOLONE 100000-0.1 UNIT/GM-% EX OINT: 1.0000 "application " | TOPICAL_OINTMENT | Freq: Two times a day (BID) | CUTANEOUS | Status: DC

## 2014-02-24 MED ORDER — FLUCONAZOLE 150 MG PO TABS: 150.0000 mg | ORAL_TABLET | Freq: Every day | ORAL | Status: DC

## 2014-02-24 MED ORDER — FLUCONAZOLE 150 MG PO TABS: ORAL_TABLET | ORAL | Status: DC

## 2014-02-24 NOTE — Discharge Instructions (Signed)
Candidal Vulvovaginitis  Candidal vulvovaginitis is an infection of the vagina and vulva. The vulva is the skin around the opening of the vagina. This may cause itching and discomfort in and around the vagina.   HOME CARE  · Only take medicine as told by your doctor.  · Do not have sex (intercourse) until the infection is healed or as told by your doctor.  · Practice safe sex.  · Tell your sex partner about your infection.  · Do not douche or use tampons.  · Wear cotton underwear. Do not wear tight pants or panty hose.  · Eat yogurt. This may help treat and prevent yeast infections.  GET HELP RIGHT AWAY IF:   · You have a fever.  · Your problems get worse during treatment or do not get better in 3 days.  · You have discomfort, irritation, or itching in your vagina or vulva area.  · You have pain after sex.  · You start to get belly (abdominal) pain.  MAKE SURE YOU:  · Understand these instructions.  · Will watch your condition.  · Will get help right away if you are not doing well or get worse.  Document Released: 02/24/2009 Document Revised: 02/20/2012 Document Reviewed: 02/24/2009  ExitCare® Patient Information ©2014 [employer].

## 2014-02-24 NOTE — MAU Provider Note (Signed)
Chief Complaint: Vaginal Itching   None    SUBJECTIVE HPI: Jodi Estrada is a 28 y.o. [id number] who presents to maternity admissions reporting vaginal itching x2 days. She was treated for trichomonas in January, her partner was treated, and she reports they have only had sex with condoms since then. In January she reported discharge, but now she has itching but no discharge she has noticed.  She did use a friend's soap when she was away from home recently.  Pt denies use of vaginal cream/treatments for yeast or recent sexual intercourse.  She denies pain, vaginal bleeding, urinary symptoms, h/a, dizziness, n/v, or fever/chills.     Past Medical History  Diagnosis Date  . Genital herpes   . Bacterial vaginosis   . Ectopic pregnancy   . Chlamydia   . Trichomonas   . Abnormal Pap smear     follow up was wnl   Past Surgical History  Procedure Laterality Date  . Dilation and curettage of uterus    . Laparoscopy  01/29/2012    Procedure: LAPAROSCOPY OPERATIVE;  Surgeon: Hollie SalkMyra C. Marice Potterove, MD;  Location: [facility];  Service: Gynecology;  Laterality: N/A;  operative laparoscopy. Right salpingectomy with removal of ectopic pregnancy   History   Social History  . Marital Status: Single    Spouse Name: N/A    Number of Children: N/A  . Years of Education: N/A   Occupational History  . Not on file.   Social History Main Topics  . Smoking status: Current Every Day Smoker -- 0.50 packs/day for 5 years    Types: Cigarettes  . Smokeless tobacco: Never Used  . Alcohol Use: Yes     Comment: occasionally- social  . Drug Use: Yes    Special: Marijuana     Comment: once in a while per patient  . Sexual Activity: Yes    Birth Control/ Protection: Condom, Inserts   Other Topics Concern  . Not on file   Social History Narrative  . No narrative on file   No current facility-administered medications on file prior to encounter.   Current Outpatient Prescriptions on File Prior to Encounter   Medication Sig Dispense Refill  . etonogestrel-ethinyl estradiol (NUVARING) 0.12-0.015 MG/24HR vaginal ring Place 1 each vaginally every 28 (twenty-eight) days. Insert vaginally and leave in place for 3 consecutive weeks, then remove for 1 week.      Marland Kitchen. ibuprofen (ADVIL,MOTRIN) 200 MG tablet Take 200-400 mg by mouth every 6 (six) hours as needed for headache or mild pain.        No Known Allergies  ROS: Pertinent items in HPI  OBJECTIVE Blood pressure 107/73, pulse 91, temperature 98.7 F (37.1 C), temperature source Oral, resp. rate 18, height 5' 5.5" (1.664 m), weight 113.853 kg (251 lb), last menstrual period 02/10/2014. GENERAL: Well-developed, well-nourished female in no acute distress.  HEENT: Normocephalic HEART: normal rate RESP: normal effort ABDOMEN: Soft, non-tender EXTREMITIES: Nontender, no edema NEURO: Alert and oriented Pelvic exam: Cervix pink, visually closed, without lesion, copious amount thick white paste-like discharge clinging to cervix and vaginal walls, vaginal walls and external genitalia with mild erythema   LAB RESULTS Results for orders placed during the hospital encounter of 02/24/14 (from the past 24 hour(s))  URINALYSIS, ROUTINE W REFLEX MICROSCOPIC     Status: Abnormal   Collection Time    02/24/14  8:24 AM      Result Value Ref Range   Color, Urine YELLOW  YELLOW   APPearance CLEAR  CLEAR   Specific Gravity, Urine >1.030 (*) 1.005 - 1.030   pH 5.5  5.0 - 8.0   Glucose, UA NEGATIVE  NEGATIVE mg/dL   Hgb urine dipstick TRACE (*) NEGATIVE   Bilirubin Urine NEGATIVE  NEGATIVE   Ketones, ur NEGATIVE  NEGATIVE mg/dL   Protein, ur NEGATIVE  NEGATIVE mg/dL   Urobilinogen, UA 0.2  0.0 - 1.0 mg/dL   Nitrite NEGATIVE  NEGATIVE   Leukocytes, UA NEGATIVE  NEGATIVE  URINE MICROSCOPIC-ADD ON     Status: Abnormal   Collection Time    02/24/14  8:24 AM      Result Value Ref Range   Squamous Epithelial / LPF FEW (*) RARE   RBC / HPF 0-2  <3 RBC/hpf    Bacteria, UA RARE  RARE  POCT PREGNANCY, URINE     Status: None   Collection Time    02/24/14  8:30 AM      Result Value Ref Range   Preg Test, Ur NEGATIVE  NEGATIVE  WET PREP, GENITAL     Status: Abnormal   Collection Time    02/24/14  9:13 AM      Result Value Ref Range   Yeast Wet Prep HPF POC FEW (*) NONE SEEN   Trich, Wet Prep NONE SEEN  NONE SEEN   Clue Cells Wet Prep HPF POC NONE SEEN  NONE SEEN   WBC, Wet Prep HPF POC MANY (*) NONE SEEN    ASSESSMENT 1. Vaginal candidiasis     PLAN Discharge home Diflucan 150 mg sent to pharmacy with 1 refill.  Take one now, 1 in 3 days. Mycolog ointment externally BID F/U in Gyn clinic Return to MAU as needed    Medication List         etonogestrel-ethinyl estradiol 0.12-0.015 MG/24HR vaginal ring  Commonly known as:  NUVARING  Place 1 each vaginally every 28 (twenty-eight) days. Insert vaginally and leave in place for 3 consecutive weeks, then remove for 1 week.     fluconazole 150 MG tablet  Commonly known as:  DIFLUCAN  Take one tablet today, then take second tablet in 3 days.     ibuprofen 200 MG tablet  Commonly known as:  ADVIL,MOTRIN  Take 200-400 mg by mouth every 6 (six) hours as needed for headache or mild pain.     nystatin-triamcinolone ointment  Commonly known as:  MYCOLOG  Apply 1 application topically 2 (two) times daily.       Follow-up Information   Schedule an appointment as soon as possible for a visit with [facility]. (As needed)    Specialty:  Obstetrics and Gynecology   Contact information:   [address] [place] [address] [phone]      Sharen Counter Certified Nurse-Midwife 02/24/2014  9:34 AM

## 2014-02-24 NOTE — MAU Provider Note (Signed)
Attestation of Attending Supervision of Advanced Practitioner (CNM/NP): Evaluation and management procedures were performed by the Advanced Practitioner under my supervision and collaboration.  I have reviewed the Advanced Practitioner's note and chart, and I agree with the management and plan.  Mariaceleste Herrera 02/24/2014 10:22 AM

## 2014-02-24 NOTE — MAU Note (Addendum)
States was treated for trich in January and was given a pill for yeast also. States all sxs subsided. A couple of days ago started feeling irritated and itchy in vaginal area. States she has not had unprotected sex since diagnosis and treatment. No notable discharge.

## 2014-02-25 LAB — GC/CHLAMYDIA PROBE AMP
CT PROBE, AMP APTIMA: NEGATIVE
GC Probe RNA: NEGATIVE

## 2014-09-20 ENCOUNTER — Emergency Department (HOSPITAL_COMMUNITY)
Admission: EM | Admit: 2014-09-20 | Discharge: 2014-09-20 | Disposition: A | Discharge status: Home / Self Care | Payer: Medicaid Other | Attending: Emergency Medicine | Admitting: Emergency Medicine

## 2014-09-20 ENCOUNTER — Encounter (HOSPITAL_COMMUNITY): Payer: Self-pay | Admitting: Emergency Medicine

## 2014-09-20 ENCOUNTER — Emergency Department (HOSPITAL_COMMUNITY): Payer: Medicaid Other

## 2014-09-20 DIAGNOSIS — Y92410 Unspecified street and highway as the place of occurrence of the external cause: Secondary | ICD-10-CM | POA: Insufficient documentation

## 2014-09-20 DIAGNOSIS — S8001XA Contusion of right knee, initial encounter: Secondary | ICD-10-CM | POA: Diagnosis not present

## 2014-09-20 DIAGNOSIS — Z79899 Other long term (current) drug therapy: Secondary | ICD-10-CM | POA: Insufficient documentation

## 2014-09-20 DIAGNOSIS — Z8619 Personal history of other infectious and parasitic diseases: Secondary | ICD-10-CM | POA: Insufficient documentation

## 2014-09-20 DIAGNOSIS — M79604 Pain in right leg: Secondary | ICD-10-CM

## 2014-09-20 DIAGNOSIS — Z8742 Personal history of other diseases of the female genital tract: Secondary | ICD-10-CM | POA: Insufficient documentation

## 2014-09-20 DIAGNOSIS — S8991XA Unspecified injury of right lower leg, initial encounter: Secondary | ICD-10-CM | POA: Diagnosis present

## 2014-09-20 DIAGNOSIS — Y9389 Activity, other specified: Secondary | ICD-10-CM | POA: Insufficient documentation

## 2014-09-20 DIAGNOSIS — Z720 Tobacco use: Secondary | ICD-10-CM | POA: Diagnosis not present

## 2014-09-20 MED ORDER — IBUPROFEN 800 MG PO TABS: 800.0000 mg | ORAL_TABLET | Freq: Three times a day (TID) | ORAL | Status: DC

## 2014-09-20 MED ORDER — IBUPROFEN 200 MG PO TABS
600.0000 mg | ORAL_TABLET | Freq: Once | ORAL | Status: AC
Start: 2014-09-20 — End: 2014-09-20
  Administered 2014-09-20: 600 mg via ORAL
  Filled 2014-09-20 (×2): qty 1

## 2014-09-20 MED ORDER — HYDROCODONE-ACETAMINOPHEN 5-325 MG PO TABS
1.0000 | ORAL_TABLET | Freq: Once | ORAL | Status: DC
  Filled 2014-09-20: qty 1

## 2014-09-20 MED ORDER — HYDROCODONE-ACETAMINOPHEN 5-325 MG PO TABS: 1.0000 | ORAL_TABLET | Freq: Four times a day (QID) | ORAL | Status: DC | PRN
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
## 2014-09-20 NOTE — ED Notes (Signed)
Patient ready to leave now, explained AMA versus waiting for discharge.  Tatyana, PA-C is now talking to the patient.

## 2014-09-20 NOTE — ED Notes (Signed)
Patient refused to wait for ortho tech for instruction, reports she already knows how to use, and wants to leave now. Discharged the patient.

## 2014-09-20 NOTE — ED Notes (Addendum)
Pt went to Peds to see son. Pt tearful at desk requesting to see son.
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
## 2014-09-20 NOTE — ED Notes (Signed)
Tatyana, PA-C, is at the bedside.  

## 2014-09-20 NOTE — ED Notes (Addendum)
Pt ambulatory to room with out assistance . Pt returned from peds dept after seeing her children.

## 2014-09-20 NOTE — ED Notes (Signed)
Called x-ray to see amount of wait time. Patient will be picked up soon
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
## 2014-09-20 NOTE — ED Notes (Signed)
Called ortho for crutches. Patient is 5'5.

## 2014-09-21 NOTE — ED Provider Notes (Signed)
Medical screening examination/treatment/procedure(s) were performed by non-physician practitioner and as supervising physician I was immediately available for consultation/collaboration.    Allyn Bertoni D Nashya Garlington, MD 09/21/14 1028 

## 2014-10-13 ENCOUNTER — Encounter (HOSPITAL_COMMUNITY): Payer: Self-pay | Admitting: Emergency Medicine

## 2014-10-29 ENCOUNTER — Ambulatory Visit: Payer: Medicaid Other | Admitting: Obstetrics and Gynecology

## 2014-10-29 ENCOUNTER — Telehealth: Payer: Self-pay | Admitting: *Deleted

## 2014-10-29 ENCOUNTER — Encounter: Payer: Self-pay | Admitting: *Deleted

## 2014-10-29 NOTE — Telephone Encounter (Signed)
Attempted to contact patient, no answer, left message for patient to call clinic and reschedule appointment.  Will send letter,.  Letter sent.

## 2014-12-04 ENCOUNTER — Inpatient Hospital Stay (HOSPITAL_COMMUNITY)
Admission: AD | Admit: 2014-12-04 | Discharge: 2014-12-04 | Disposition: A | Discharge status: Home / Self Care | Payer: Medicaid Other | Source: Ambulatory Visit | Attending: Obstetrics and Gynecology | Admitting: Obstetrics and Gynecology

## 2014-12-04 ENCOUNTER — Encounter (HOSPITAL_COMMUNITY): Payer: Self-pay | Admitting: *Deleted

## 2014-12-04 DIAGNOSIS — B9689 Other specified bacterial agents as the cause of diseases classified elsewhere: Secondary | ICD-10-CM | POA: Diagnosis not present

## 2014-12-04 DIAGNOSIS — N898 Other specified noninflammatory disorders of vagina: Secondary | ICD-10-CM | POA: Diagnosis present

## 2014-12-04 DIAGNOSIS — F17210 Nicotine dependence, cigarettes, uncomplicated: Secondary | ICD-10-CM | POA: Diagnosis not present

## 2014-12-04 DIAGNOSIS — L298 Other pruritus: Secondary | ICD-10-CM | POA: Diagnosis present

## 2014-12-04 DIAGNOSIS — N760 Acute vaginitis: Secondary | ICD-10-CM | POA: Insufficient documentation

## 2014-12-04 DIAGNOSIS — A499 Bacterial infection, unspecified: Secondary | ICD-10-CM

## 2014-12-04 LAB — URINALYSIS, ROUTINE W REFLEX MICROSCOPIC
BILIRUBIN URINE: NEGATIVE
Glucose, UA: NEGATIVE mg/dL
KETONES UR: NEGATIVE mg/dL
Leukocytes, UA: NEGATIVE
Nitrite: NEGATIVE
PROTEIN: NEGATIVE mg/dL
Specific Gravity, Urine: <1.005 — ABNORMAL LOW (ref 1.005–1.030)
UROBILINOGEN UA: 0.2 mg/dL (ref 0.0–1.0)
pH: 5.5 (ref 5.0–8.0)

## 2014-12-04 LAB — WET PREP, GENITAL
Trich, Wet Prep: NONE SEEN
Yeast Wet Prep HPF POC: NONE SEEN

## 2014-12-04 LAB — URINE MICROSCOPIC-ADD ON

## 2014-12-04 LAB — POCT PREGNANCY, URINE: PREG TEST UR: NEGATIVE

## 2014-12-04 MED ORDER — METRONIDAZOLE 500 MG PO TABS: 500.0000 mg | ORAL_TABLET | Freq: Two times a day (BID) | ORAL | Status: DC

## 2014-12-04 MED ORDER — FLUCONAZOLE 150 MG PO TABS: 150.0000 mg | ORAL_TABLET | Freq: Every day | ORAL | Status: AC

## 2014-12-04 NOTE — MAU Note (Signed)
Pt reports vagina itching and discharge x 2 days.

## 2014-12-04 NOTE — MAU Provider Note (Signed)
History     CSN: [id number]  Arrival date and time: 12/04/14 [id number]   First Provider Initiated Contact with Patient 12/04/14 0543      Chief Complaint  Patient presents with  . Vaginal Discharge  . Vaginal Itching   HPI   [name] is a 28 y.o. female [phone] who presents with vaginal discharge 1-2 days ago. The discharge is normal in color/ has a slight odor with itching.  She has not tried anything over the counter The patient is unsure whether she has a PCP.   No new sexual partners   OB History    Gravida Para Term Preterm AB TAB SAB Ectopic Multiple Living   6 3 3  0 3 2 0 1 0 3      Past Medical History  Diagnosis Date  . Genital herpes   . Bacterial vaginosis   . Ectopic pregnancy   . Chlamydia   . Trichomonas   . Abnormal Pap smear     follow up was wnl    Past Surgical History  Procedure Laterality Date  . Dilation and curettage of uterus    . Laparoscopy  01/29/2012    Procedure: LAPAROSCOPY OPERATIVE;  Surgeon: Jodi SalkMyra C. Marice Potterove, MD;  Location: [facility];  Service: Gynecology;  Laterality: N/A;  operative laparoscopy. Right salpingectomy with removal of ectopic pregnancy    Family History  Problem Relation Age of Onset  . Hypertension Maternal Grandmother   . Diabetes Maternal Grandmother   . Anesthesia problems Neg Hx   . Other Neg Hx   . Migraines Sister     History  Substance Use Topics  . Smoking status: Current Every Day Smoker -- 0.50 packs/day for 5 years    Types: Cigarettes  . Smokeless tobacco: Never Used  . Alcohol Use: Yes     Comment: occasionally- social    Allergies: No Known Allergies  Prescriptions prior to admission  Medication Sig Dispense Refill Last Dose  . etonogestrel-ethinyl estradiol (NUVARING) 0.12-0.015 MG/24HR vaginal ring Place 1 each vaginally every 28 (twenty-eight) days. Insert vaginally and leave in place for 3 consecutive weeks, then remove for 1 week.   12/2013  . fluconazole (DIFLUCAN) 150 MG tablet  Take one tablet today, then take second tablet in 3 days. 1 tablet 1   . HYDROcodone-acetaminophen (NORCO) 5-325 MG per tablet Take 1 tablet by mouth every 6 (six) hours as needed for moderate pain. 20 tablet 0   . ibuprofen (ADVIL,MOTRIN) 200 MG tablet Take 200-400 mg by mouth every 6 (six) hours as needed for headache or mild pain.    Past Month at Unknown time  . ibuprofen (ADVIL,MOTRIN) 800 MG tablet Take 1 tablet (800 mg total) by mouth 3 (three) times daily. 21 tablet 0   . nystatin-triamcinolone ointment (MYCOLOG) Apply 1 application topically 2 (two) times daily. 30 g 0     Results for orders placed or performed during the hospital encounter of 12/04/14 (from the past 48 hour(s))  Urinalysis, Routine w reflex microscopic     Status: Abnormal   Collection Time: 12/04/14  4:43 AM  Result Value Ref Range   Color, Urine YELLOW YELLOW   APPearance CLEAR CLEAR   Specific Gravity, Urine <1.005 (L) 1.005 - 1.030   pH 5.5 5.0 - 8.0   Glucose, UA NEGATIVE NEGATIVE mg/dL   Hgb urine dipstick SMALL (A) NEGATIVE   Bilirubin Urine NEGATIVE NEGATIVE   Ketones, ur NEGATIVE NEGATIVE mg/dL   Protein, ur NEGATIVE  NEGATIVE mg/dL   Urobilinogen, UA 0.2 0.0 - 1.0 mg/dL   Nitrite NEGATIVE NEGATIVE   Leukocytes, UA NEGATIVE NEGATIVE  Urine microscopic-add on     Status: None   Collection Time: 12/04/14  4:43 AM  Result Value Ref Range   Squamous Epithelial / LPF RARE RARE   WBC, UA 0-2 <3 WBC/hpf   Bacteria, UA RARE RARE  Wet prep, genital     Status: Abnormal   Collection Time: 12/04/14  5:15 AM  Result Value Ref Range   Yeast Wet Prep HPF POC NONE SEEN NONE SEEN   Trich, Wet Prep NONE SEEN NONE SEEN   Clue Cells Wet Prep HPF POC FEW (A) NONE SEEN   WBC, Wet Prep HPF POC FEW (A) NONE SEEN    Comment: FEW BACTERIA SEEN  Pregnancy, urine POC     Status: None   Collection Time: 12/04/14  5:16 AM  Result Value Ref Range   Preg Test, Ur NEGATIVE NEGATIVE    Comment:        THE SENSITIVITY OF  THIS METHODOLOGY IS >24 mIU/mL     Review of Systems  Constitutional: Negative for fever and chills.  Gastrointestinal: Negative for nausea, vomiting, abdominal pain, diarrhea and constipation.  Genitourinary: Negative for dysuria, urgency, frequency and hematuria.   Physical Exam   Blood pressure 126/86, pulse 128, temperature 99.6 F (37.6 C), temperature source Oral, resp. rate 16, height 5' 4.5" (1.638 m), weight 118.842 kg (262 lb), last menstrual period 11/05/2014, SpO2 98 %.  Physical Exam  Constitutional: She is oriented to person, place, and time. She appears well-developed and well-nourished.  Non-toxic appearance. She does not have a sickly appearance. She does not appear ill. No distress.  HENT:  Head: Normocephalic.  Eyes: Pupils are equal, round, and reactive to light.  Neck: Neck supple.  Respiratory: Effort normal.  Musculoskeletal: Normal range of motion.  Neurological: She is alert and oriented to person, place, and time.  Skin: Skin is warm. She is not diaphoretic.  Psychiatric: Her behavior is normal.    MAU Course  Procedures  None  MDM GC urine pending Wet prep- collected by RN     Assessment and Plan   A:  Bacterial vaginosis  P:  Discharge home in stable condition RX: Flagyl- no alcohol  Return to MAU for emergencies only Discussed the importance of PCP  [facility] HospitalRASCH, Clea Dubach IRENE 12/04/2014, 5:43 AM

## 2014-12-04 NOTE — Discharge Instructions (Signed)
Bacterial Vaginosis Bacterial vaginosis is a vaginal infection that occurs when the normal balance of bacteria in the vagina is disrupted. It results from an overgrowth of certain bacteria. This is the most common vaginal infection in women of childbearing age. Treatment is important to prevent complications, especially in pregnant women, as it can cause a premature delivery. CAUSES  Bacterial vaginosis is caused by an increase in harmful bacteria that are normally present in smaller amounts in the vagina. Several different kinds of bacteria can cause bacterial vaginosis. However, the reason that the condition develops is not fully understood. RISK FACTORS Certain activities or behaviors can put you at an increased risk of developing bacterial vaginosis, including:  Having a new sex partner or multiple sex partners.  Douching.  Using an intrauterine device (IUD) for contraception. Women do not get bacterial vaginosis from toilet seats, bedding, swimming pools, or contact with objects around them. SIGNS AND SYMPTOMS  Some women with bacterial vaginosis have no signs or symptoms. Common symptoms include:  Grey vaginal discharge.  A fishlike odor with discharge, especially after sexual intercourse.  Itching or burning of the vagina and vulva.  Burning or pain with urination. DIAGNOSIS  Your health care provider will take a medical history and examine the vagina for signs of bacterial vaginosis. A sample of vaginal fluid may be taken. Your health care provider will look at this sample under a microscope to check for bacteria and abnormal cells. A vaginal pH test may also be done.  TREATMENT  Bacterial vaginosis may be treated with antibiotic medicines. These may be given in the form of a pill or a vaginal cream. A second round of antibiotics may be prescribed if the condition comes back after treatment.  HOME CARE INSTRUCTIONS   Only take over-the-counter or prescription medicines as  directed by your health care provider.  If antibiotic medicine was prescribed, take it as directed. Make sure you finish it even if you start to feel better.  Do not have sex until treatment is completed.  Tell all sexual partners that you have a vaginal infection. They should see their health care provider and be treated if they have problems, such as a mild rash or itching.  Practice safe sex by using condoms and only having one sex partner. SEEK MEDICAL CARE IF:   Your symptoms are not improving after 3 days of treatment.  You have increased discharge or pain.  You have a fever. MAKE SURE YOU:   Understand these instructions.  Will watch your condition.  Will get help right away if you are not doing well or get worse. FOR MORE INFORMATION  Centers for Disease Control and Prevention, Division of STD Prevention: www.cdc.gov/std American Sexual Health Association (ASHA): www.ashastd.org  Document Released: 11/28/2005 Document Revised: 09/18/2013 Document Reviewed: 07/10/2013 ExitCare Patient Information 2015 [employer], [employer]. This information is not intended to replace advice given to you by your health care provider. Make sure you discuss any questions you have with your health care provider.  

## 2014-12-05 LAB — GC/CHLAMYDIA PROBE AMP
CT Probe RNA: NEGATIVE
GC PROBE AMP APTIMA: NEGATIVE

## 2015-02-04 ENCOUNTER — Encounter (HOSPITAL_COMMUNITY): Payer: Self-pay | Admitting: *Deleted

## 2015-02-04 ENCOUNTER — Inpatient Hospital Stay (HOSPITAL_COMMUNITY)
Admission: AD | Admit: 2015-02-04 | Discharge: 2015-02-04 | Disposition: A | Discharge status: Home / Self Care | Payer: Medicaid Other | Source: Ambulatory Visit | Attending: Obstetrics and Gynecology | Admitting: Obstetrics and Gynecology

## 2015-02-04 DIAGNOSIS — B9689 Other specified bacterial agents as the cause of diseases classified elsewhere: Secondary | ICD-10-CM

## 2015-02-04 DIAGNOSIS — N898 Other specified noninflammatory disorders of vagina: Secondary | ICD-10-CM | POA: Insufficient documentation

## 2015-02-04 DIAGNOSIS — L293 Anogenital pruritus, unspecified: Secondary | ICD-10-CM | POA: Diagnosis present

## 2015-02-04 DIAGNOSIS — F17210 Nicotine dependence, cigarettes, uncomplicated: Secondary | ICD-10-CM | POA: Diagnosis not present

## 2015-02-04 DIAGNOSIS — N9089 Other specified noninflammatory disorders of vulva and perineum: Secondary | ICD-10-CM

## 2015-02-04 DIAGNOSIS — N760 Acute vaginitis: Secondary | ICD-10-CM

## 2015-02-04 LAB — URINALYSIS, ROUTINE W REFLEX MICROSCOPIC
Bilirubin Urine: NEGATIVE
Glucose, UA: NEGATIVE mg/dL
Hgb urine dipstick: NEGATIVE
Ketones, ur: NEGATIVE mg/dL
Leukocytes, UA: NEGATIVE
Nitrite: NEGATIVE
PROTEIN: NEGATIVE mg/dL
Specific Gravity, Urine: 1.02 (ref 1.005–1.030)
UROBILINOGEN UA: 0.2 mg/dL (ref 0.0–1.0)
pH: 6 (ref 5.0–8.0)

## 2015-02-04 LAB — WET PREP, GENITAL
Clue Cells Wet Prep HPF POC: NONE SEEN
Trich, Wet Prep: NONE SEEN
YEAST WET PREP: NONE SEEN

## 2015-02-04 LAB — POCT PREGNANCY, URINE: Preg Test, Ur: NEGATIVE

## 2015-02-04 MED ORDER — FLUCONAZOLE 150 MG PO TABS: ORAL_TABLET | ORAL | Status: DC

## 2015-02-04 MED ORDER — HYDROCORTISONE 1 % EX CREA: TOPICAL_CREAM | CUTANEOUS | Status: DC

## 2015-02-04 MED ORDER — METRONIDAZOLE 500 MG PO TABS: 500.0000 mg | ORAL_TABLET | Freq: Two times a day (BID) | ORAL | Status: DC

## 2015-02-04 NOTE — MAU Provider Note (Signed)
History     CSN: [id number]  Arrival date & time 02/04/15  1427   First Provider Initiated Contact with Patient 02/04/15 1902      Chief Complaint  Patient presents with  . Vaginal Itching    HPI 28 yr [id number] lmp 2.20.16 using NUvaring for [facility], seen for vulvar irritation with end of menses. Single partner x 3 yr.  Considering conversion to Nexplanon due to aversion to self exam and dischg of use of Nuvaring.  Seen in Dec , Dx'd as having BV. Notes increased d/c since having children.   Past Medical History  Diagnosis Date  . Genital herpes   . Bacterial vaginosis   . Ectopic pregnancy   . Chlamydia   . Trichomonas   . Abnormal Pap smear     follow up was wnl    Past Surgical History  Procedure Laterality Date  . Dilation and curettage of uterus    . Laparoscopy  01/29/2012    Procedure: LAPAROSCOPY OPERATIVE;  Surgeon: Hollie Salk C. Marice Potter, MD;  Location: [facility];  Service: Gynecology;  Laterality: N/A;  operative laparoscopy. Right salpingectomy with removal of ectopic pregnancy    Family History  Problem Relation Age of Onset  . Hypertension Maternal Grandmother   . Diabetes Maternal Grandmother   . Anesthesia problems Neg Hx   . Other Neg Hx   . Migraines Sister     History  Substance Use Topics  . Smoking status: Current Every Day Smoker -- 0.50 packs/day for 5 years    Types: Cigarettes  . Smokeless tobacco: Never Used  . Alcohol Use: Yes     Comment: occasionally- social    OB History    Gravida Para Term Preterm AB TAB SAB Ectopic Multiple Living   0 3 2 0 1 0 3      Review of Systems no fever, chills.  Allergies  Review of patient's allergies indicates no known allergies.  Home Medications  No current outpatient prescriptions on file.  BP 123/77 mmHg  Pulse 74  Temp(Src) 98.3 F (36.8 C) (Oral)  Resp 18  Ht  (1.626 m)  Wt 123.56 kg (272 lb 6.4 oz)  BMI 46.73 kg/m2  LMP 01/31/2015  Physical Exam  Constitutional: She is oriented  to person, place, and time. She appears well-developed and well-nourished.  HENT:  Head: Normocephalic.  Eyes: Pupils are equal, round, and reactive to light.  Cardiovascular: Normal rate.   Pulmonary/Chest: Effort normal.  Abdominal: Soft.  Genitourinary: Vagina normal.  Physical Examination: Pelvic - VULVA: vulvar tenderness at clitoral crease, vulvar excoriation tiny excoriations to left,s/p rubbing with cloth for comfort, VAGINA: normal appearing vagina with normal color and discharge, no lesions, vaginal discharge - copious and creamy, WET MOUNT done - results: negative for pathogens, normal epithelial cells, DNA probe for chlamydia and GC obtained, CERVIX: normal appearing cervix without discharge or lesions, UTERUS: uterus is normal size, shape, consistency and nontender, ADNEXA: normal adnexa in size, nontender and no masses   Musculoskeletal: Normal range of motion.  Neurological: She is alert and oriented to person, place, and time. She has normal reflexes.  Skin: Skin is warm and dry.  Psychiatric: She has a normal mood and affect. Her behavior is normal. Judgment and thought content normal.    MAU Course  Procedures (including critical care time)  Labs Reviewed  URINALYSIS, ROUTINE W REFLEX MICROSCOPIC  POCT PREGNANCY, URINE   No results found.   No diagnosis found.  MDM  1 vulvar irritation, chronic moisture 2  ;possible Recurrent BV  PLAN; Rx: Metronidazole 500 bid x 7 d                  Diflucan 150 mg q3d x 2 pills                  1% hydrocortisone topical              Dry vulva regimen.                 F/u GC/Chl by phone.               Pt to complete MYChart application.

## 2015-02-04 NOTE — MAU Note (Signed)
Having vaginal itching & irritation, coming off of period.  Had BV in December, thinks she may have it again.  Denies pain.

## 2015-02-04 NOTE — Discharge Instructions (Signed)
Bacterial Vaginosis Bacterial vaginosis is a vaginal infection that occurs when the normal balance of bacteria in the vagina is disrupted. It results from an overgrowth of certain bacteria. This is the most common vaginal infection in women of childbearing age. Treatment is important to prevent complications, especially in pregnant women, as it can cause a premature delivery. CAUSES  Bacterial vaginosis is caused by an increase in harmful bacteria that are normally present in smaller amounts in the vagina. Several different kinds of bacteria can cause bacterial vaginosis. However, the reason that the condition develops is not fully understood. RISK FACTORS Certain activities or behaviors can put you at an increased risk of developing bacterial vaginosis, including:  Having a new sex partner or multiple sex partners.  Douching.  Using an intrauterine device (IUD) for contraception. Women do not get bacterial vaginosis from toilet seats, bedding, swimming pools, or contact with objects around them. SIGNS AND SYMPTOMS  Some women with bacterial vaginosis have no signs or symptoms. Common symptoms include:  Grey vaginal discharge.  A fishlike odor with discharge, especially after sexual intercourse.  Itching or burning of the vagina and vulva.  Burning or pain with urination. DIAGNOSIS  Your health care provider will take a medical history and examine the vagina for signs of bacterial vaginosis. A sample of vaginal fluid may be taken. Your health care provider will look at this sample under a microscope to check for bacteria and abnormal cells. A vaginal pH test may also be done.  TREATMENT  Bacterial vaginosis may be treated with antibiotic medicines. These may be given in the form of a pill or a vaginal cream. A second round of antibiotics may be prescribed if the condition comes back after treatment.  HOME CARE INSTRUCTIONS   Only take over-the-counter or prescription medicines as  directed by your health care provider.  If antibiotic medicine was prescribed, take it as directed. Make sure you finish it even if you start to feel better.  Do not have sex until treatment is completed.  Tell all sexual partners that you have a vaginal infection. They should see their health care provider and be treated if they have problems, such as a mild rash or itching.  Practice safe sex by using condoms and only having one sex partner. SEEK MEDICAL CARE IF:   Your symptoms are not improving after 3 days of treatment.  You have increased discharge or pain.  You have a fever. MAKE SURE YOU:   Understand these instructions.  Will watch your condition.  Will get help right away if you are not doing well or get worse. FOR MORE INFORMATION  Centers for Disease Control and Prevention, Division of STD Prevention: www.cdc.gov/std American Sexual Health Association (ASHA): www.ashastd.org  Document Released: 11/28/2005 Document Revised: 09/18/2013 Document Reviewed: 07/10/2013 ExitCare Patient Information 2015 [employer], [employer]. This information is not intended to replace advice given to you by your health care provider. Make sure you discuss any questions you have with your health care provider.  

## 2015-02-05 LAB — GC/CHLAMYDIA PROBE AMP (~~LOC~~) NOT AT ARMC
Chlamydia: NEGATIVE
Neisseria Gonorrhea: NEGATIVE

## 2015-03-11 ENCOUNTER — Ambulatory Visit: Payer: Medicaid Other | Admitting: Obstetrics & Gynecology

## 2016-02-11 ENCOUNTER — Ambulatory Visit (INDEPENDENT_AMBULATORY_CARE_PROVIDER_SITE_OTHER): Payer: Medicaid Other | Admitting: Advanced Practice Midwife

## 2016-02-11 ENCOUNTER — Encounter: Payer: Self-pay | Admitting: Advanced Practice Midwife

## 2016-02-11 VITALS — BP 116/77 | HR 62 | Ht 66.0 in | Wt 274.1 lb

## 2016-02-11 DIAGNOSIS — Z01419 Encounter for gynecological examination (general) (routine) without abnormal findings: Secondary | ICD-10-CM

## 2016-02-11 DIAGNOSIS — Z975 Presence of (intrauterine) contraceptive device: Secondary | ICD-10-CM | POA: Insufficient documentation

## 2016-02-11 DIAGNOSIS — Z113 Encounter for screening for infections with a predominantly sexual mode of transmission: Secondary | ICD-10-CM

## 2016-02-11 DIAGNOSIS — Z30430 Encounter for insertion of intrauterine contraceptive device: Secondary | ICD-10-CM

## 2016-02-11 DIAGNOSIS — Z124 Encounter for screening for malignant neoplasm of cervix: Secondary | ICD-10-CM

## 2016-02-11 LAB — POCT PREGNANCY, URINE: PREG TEST UR: NEGATIVE

## 2016-02-11 MED ORDER — LEVONORGESTREL 18.6 MCG/DAY IU IUD
INTRAUTERINE_SYSTEM | Freq: Once | INTRAUTERINE | Status: AC
  Administered 2016-02-11: 15:00:00 via INTRAUTERINE

## 2016-02-11 NOTE — Patient Instructions (Signed)
Levonorgestrel intrauterine device (IUD) What is this medicine? LEVONORGESTREL IUD (LEE voe nor jes trel) is a contraceptive (birth control) device. The device is placed inside the uterus by a healthcare professional. It is used to prevent pregnancy and can also be used to treat heavy bleeding that occurs during your period. Depending on the device, it can be used for 3 to 5 years. This medicine may be used for other purposes; ask your health care provider or pharmacist if you have questions. What should I tell my health care provider before I take this medicine? They need to know if you have any of these conditions: -abnormal Pap smear -cancer of the breast, uterus, or cervix -diabetes -endometritis -genital or pelvic infection now or in the past -have more than one sexual partner or your partner has more than one partner -heart disease -history of an ectopic or tubal pregnancy -immune system problems -IUD in place -liver disease or tumor -problems with blood clots or take blood-thinners -use intravenous drugs -uterus of unusual shape -vaginal bleeding that has not been explained -an unusual or allergic reaction to levonorgestrel, other hormones, silicone, or polyethylene, medicines, foods, dyes, or preservatives -pregnant or trying to get pregnant -breast-feeding How should I use this medicine? This device is placed inside the uterus by a health care professional. Talk to your pediatrician regarding the use of this medicine in children. Special care may be needed. Overdosage: If you think you have taken too much of this medicine contact a poison control center or emergency room at once. NOTE: This medicine is only for you. Do not share this medicine with others. What if I miss a dose? This does not apply. What may interact with this medicine? Do not take this medicine with any of the following medications: -amprenavir -bosentan -fosamprenavir This medicine may also interact with  the following medications: -aprepitant -barbiturate medicines for inducing sleep or treating seizures -bexarotene -griseofulvin -medicines to treat seizures like carbamazepine, ethotoin, felbamate, oxcarbazepine, phenytoin, topiramate -modafinil -pioglitazone -rifabutin -rifampin -rifapentine -some medicines to treat HIV infection like atazanavir, indinavir, lopinavir, nelfinavir, tipranavir, ritonavir -St. John's wort -warfarin This list may not describe all possible interactions. Give your health care provider a list of all the medicines, herbs, non-prescription drugs, or dietary supplements you use. Also tell them if you smoke, drink alcohol, or use illegal drugs. Some items may interact with your medicine. What should I watch for while using this medicine? Visit your doctor or health care professional for regular check ups. See your doctor if you or your partner has sexual contact with others, becomes HIV positive, or gets a sexual transmitted disease. This product does not protect you against HIV infection (AIDS) or other sexually transmitted diseases. You can check the placement of the IUD yourself by reaching up to the top of your vagina with clean fingers to feel the threads. Do not pull on the threads. It is a good habit to check placement after each menstrual period. Call your doctor right away if you feel more of the IUD than just the threads or if you cannot feel the threads at all. The IUD may come out by itself. You may become pregnant if the device comes out. If you notice that the IUD has come out use a backup birth control method like condoms and call your health care provider. Using tampons will not change the position of the IUD and are okay to use during your period. What side effects may I notice from receiving this medicine?   Side effects that you should report to your doctor or health care professional as soon as possible: -allergic reactions like skin rash, itching or  hives, swelling of the face, lips, or tongue -fever, flu-like symptoms -genital sores -high blood pressure -no menstrual period for 6 weeks during use -pain, swelling, warmth in the leg -pelvic pain or tenderness -severe or sudden headache -signs of pregnancy -stomach cramping -sudden shortness of breath -trouble with balance, talking, or walking -unusual vaginal bleeding, discharge -yellowing of the eyes or skin Side effects that usually do not require medical attention (report to your doctor or health care professional if they continue or are bothersome): -acne -breast pain -change in sex drive or performance -changes in weight -cramping, dizziness, or faintness while the device is being inserted -headache -irregular menstrual bleeding within first 3 to 6 months of use -nausea This list may not describe all possible side effects. Call your doctor for medical advice about side effects. You may report side effects to FDA at 1-800-FDA-1088. Where should I keep my medicine? This does not apply. NOTE: This sheet is a summary. It may not cover all possible information. If you have questions about this medicine, talk to your doctor, pharmacist, or health care provider.    2016, Elsevier/Gold Standard. (2011-12-29 13:54:04)  

## 2016-02-11 NOTE — Progress Notes (Signed)
Subjective:     Jodi Estrada is a 30 y.o. female and is here for a comprehensive physical exam. The patient reports no problems.  She is concerned about intermittent itching around rectum and wonders if she may have hemorrhoids.  She denies any bleeding or pain.  Reports intermittent bouts of constipation.  Denies any breast pain, lumps, or nipple discharge.  Denies vaginal discharge.  LMP 01/17/16 with regular menses.  Has not had any contraception in ~3 years and wonders about options.  Social History   Social History  . Marital Status: Single    Spouse Name: N/A  . Number of Children: N/A  . Years of Education: N/A   Occupational History  . Not on file.   Social History Main Topics  . Smoking status: Current Every Day Smoker -- 0.50 packs/day for 5 years    Types: Cigarettes  . Smokeless tobacco: Never Used  . Alcohol Use: Yes     Comment: occasionally- social  . Drug Use: Yes    Special: Marijuana     Comment: once in a while per patient  . Sexual Activity: Yes    Birth Control/ Protection: Condom, Inserts   Other Topics Concern  . Not on file   Social History Narrative   Health Maintenance  Topic Date Due  . Janet Berlin  07/17/2006  . PAP SMEAR  04/26/2015  . INFLUENZA VACCINE  07/13/2015  . HIV Screening  Completed    The following portions of the patient's history were reviewed and updated as appropriate: allergies, current medications, past family history, past medical history, past social history, past surgical history and problem list.  Review of Systems Pertinent items noted in HPI and remainder of comprehensive ROS otherwise negative.   Objective:    BP 116/77 mmHg  Pulse 62  Ht  (1.676 m)  Wt 274 lb 1.6 oz (124.331 kg)  BMI 44.26 kg/m2  LMP 01/17/2016 (Exact Date) General appearance: alert, cooperative, no distress and moderately obese Neck: no adenopathy, supple, symmetrical, trachea midline and thyroid not enlarged, symmetric, no  tenderness/mass/nodules Lungs: clear to auscultation bilaterally Breasts: normal appearance, no masses or tenderness, No nipple retraction or dimpling, No nipple discharge or bleeding, No axillary or supraclavicular adenopathy Heart: regular rate and rhythm, S1, S2 normal, no murmur, click, rub or gallop Abdomen: soft, non-tender; bowel sounds normal; no masses,  no organomegaly Pelvic: cervix normal in appearance, external genitalia normal, no adnexal masses or tenderness, no cervical motion tenderness, rectovaginal septum normal, uterus normal size, shape, and consistency and vagina normal without discharge  Rectum: No visible hemorrhoids or fissures. No palpable internal hemorrhoids on internal DRE. Extremities: extremities normal, atraumatic, no cyanosis or edema Skin: Skin color, texture, turgor normal. No rashes or lesions Neurologic: Grossly normal    Assessment:    Healthy female exam. IUD insertion, pap smear, GC/CT, wet prep collected.      Plan:      IUD Insertion Procedure Note  Pre-operative Diagnosis: desire for contraception  Post-operative Diagnosis: normal  Indications: contraception  Procedure Details  Urine pregnancy test was done 02/11/2016 and result was negative.  The risks (including infection, bleeding, pain, and uterine perforation) and benefits of the procedure were explained to the patient and Written informed consent was obtained.    Cervix cleansed with Betadine. Uterus sounded to 9 cm. IUD inserted without difficulty. String visible and trimmed. Patient tolerated procedure well.  IUD Information:  Liletta.  Condition:  Stable  Complications:  None  Plan:  The patient was advised to call for any fever or for prolonged or severe pain or bleeding. She was advised to use OTC analgesics as needed for mild to moderate pain.    See After Visit Summary for Counseling Recommendations    Erasmo Downer, MD, MPH PGY-2,  [facility] 02/11/2016 3:00 PM   Patient seen and examined by me also Pelvic exam within normal limits Thorough explanations of contraceptive options reviewed Full explanation of Lilletta IUD given with demonstration/visuals and patient consented to procedure I was present for procedure and agree with above note IUD inserted without incident Tolerated it well Small to Moderate bleeding from tenaculum  Wynelle Bourgeois CNM

## 2016-02-12 LAB — GC/CHLAMYDIA PROBE AMP (~~LOC~~) NOT AT ARMC
Chlamydia: NEGATIVE
Neisseria Gonorrhea: NEGATIVE

## 2016-02-12 LAB — WET PREP, GENITAL
Trich, Wet Prep: NONE SEEN
YEAST WET PREP: NONE SEEN

## 2016-02-12 LAB — CYTOLOGY - PAP

## 2016-03-10 ENCOUNTER — Encounter: Payer: Self-pay | Admitting: Obstetrics and Gynecology

## 2016-03-10 ENCOUNTER — Ambulatory Visit (INDEPENDENT_AMBULATORY_CARE_PROVIDER_SITE_OTHER): Payer: Self-pay | Admitting: Obstetrics and Gynecology

## 2016-03-10 VITALS — BP 114/71 | HR 79 | Temp 98.2°F | Ht 65.5 in | Wt 279.1 lb

## 2016-03-10 DIAGNOSIS — Z30431 Encounter for routine checking of intrauterine contraceptive device: Secondary | ICD-10-CM

## 2016-03-10 NOTE — Progress Notes (Signed)
Patient ID: Jodi Estrada, female   DOB: 01/01/1986, 30 y.o.   MRN: [id number] 30 yo [id number] here for IUD check. Patient had her IUD inserted 30 days ago. She reports daily spotting since insertion. The spotting is only noted when she wipes and does not require the use of a pad or panty liner. She has not been sexually active for fear that the bleeding was indicative of the IUD being malpositioned.  Past Medical History  Diagnosis Date  . Genital herpes   . Bacterial vaginosis   . Ectopic pregnancy   . Chlamydia   . Trichomonas   . Abnormal Pap smear     follow up was wnl   Past Surgical History  Procedure Laterality Date  . Dilation and curettage of uterus    . Laparoscopy  01/29/2012    Procedure: LAPAROSCOPY OPERATIVE;  Surgeon: Hollie SalkMyra C. Marice Potterove, MD;  Location: [facility];  Service: Gynecology;  Laterality: N/A;  operative laparoscopy. Right salpingectomy with removal of ectopic pregnancy   Family History  Problem Relation Age of Onset  . Hypertension Maternal Grandmother   . Diabetes Maternal Grandmother   . Anesthesia problems Neg Hx   . Other Neg Hx   . Migraines Sister    Social History  Substance Use Topics  . Smoking status: Current Every Day Smoker -- 0.50 packs/day for 5 years    Types: Cigarettes  . Smokeless tobacco: Never Used  . Alcohol Use: Yes     Comment: occasionally- social   Blood pressure 114/71, pulse 79, temperature 98.2 F (36.8 C), temperature source Oral, height 5' 5.5" (1.664 m), weight 279 lb 1.6 oz (126.599 kg), last menstrual period 02/14/2016. GENERAL: Well-developed, well-nourished female in no acute distress.  ABDOMEN: Soft, nontender, nondistended. No organomegaly. PELVIC: Normal external female genitalia. Vagina is pink and rugated.  Normal discharge. Normal appearing cervix with IUD strings extending 2.5 cm from the os. No blood is seen in the vault. Uterus is normal in size. No adnexal mass or tenderness. EXTREMITIES: No cyanosis, clubbing, or  edema, 2+ distal pulses.  A/P 30 yo here for IUD check - IUD appears to be in the appropriate location - Reminded the patient that irregular bleeding is expected for the first 3-6 months following placement - She agrees to keep the IUD until May and may decide to have it removed if bleeding continues - RTC prn

## 2016-06-29 ENCOUNTER — Ambulatory Visit (INDEPENDENT_AMBULATORY_CARE_PROVIDER_SITE_OTHER): Payer: Self-pay | Admitting: Obstetrics & Gynecology

## 2016-06-29 ENCOUNTER — Encounter: Payer: Self-pay | Admitting: Obstetrics & Gynecology

## 2016-06-29 VITALS — BP 107/80 | HR 80 | Wt 274.0 lb

## 2016-06-29 DIAGNOSIS — Z113 Encounter for screening for infections with a predominantly sexual mode of transmission: Secondary | ICD-10-CM

## 2016-06-29 DIAGNOSIS — Z30431 Encounter for routine checking of intrauterine contraceptive device: Secondary | ICD-10-CM

## 2016-06-29 DIAGNOSIS — N926 Irregular menstruation, unspecified: Secondary | ICD-10-CM

## 2016-06-29 LAB — POCT URINALYSIS DIP (DEVICE)
Bilirubin Urine: NEGATIVE
GLUCOSE, UA: NEGATIVE mg/dL
KETONES UR: NEGATIVE mg/dL
Leukocytes, UA: NEGATIVE
Nitrite: NEGATIVE
PH: 5.5 (ref 5.0–8.0)
PROTEIN: NEGATIVE mg/dL
SPECIFIC GRAVITY, URINE: 1.025 (ref 1.005–1.030)
UROBILINOGEN UA: 1 mg/dL (ref 0.0–1.0)

## 2016-06-29 NOTE — Progress Notes (Signed)
   Subjective:    Patient ID: Jodi Estrada, female    DOB: 03/04/1986, 30 y.o.   MRN: [id number]  HPI 30 yo S AA lady is here because her boyfriend "feels something" when they are having sex. She has some irregular bleeding and some PCbleeing since having the Liletta placed 2/17. She wants to continue with this method of contraception at the present time. She also complains of itching in there groin BL.  Review of Systems     Objective:   Physical Exam Morbidly obese BFNAD Breathing and conversing normally Cervix with Nabothian cysts (several) and a IUD string (about 2 cm long) I cut the strings flush with the cervical os. I could not feel it with my finger after I cut it; however, I could feel the Nabothian cysts. Both groins appear normal.     Assessment & Plan:  Contraception- continue Liletta She would like CT/GC testing Rec baby powder for her groin

## 2016-06-29 NOTE — Addendum Note (Signed)
Addended by: Kathee DeltonHILLMAN, Carston Riedl L on: 06/29/2016 03:59 PM   Modules accepted: Kipp BroodSmartSet

## 2016-07-06 ENCOUNTER — Telehealth: Payer: Self-pay | Admitting: *Deleted

## 2016-07-06 NOTE — Telephone Encounter (Signed)
Investigated test results.  Patient's GC/chlamydia specimen was not sent for processing.  Spoke with patient via phone and explained issue.  Patient will come by office tomorrow 07/07/16 to give urine specimen for GC/Chlamydia to be completed.  Will send to [facility] for processing and will call patient with results.

## 2016-07-06 NOTE — Telephone Encounter (Signed)
Received call from patient left on nurse voicemail on 07/06/16 at 1048.  Patient states she would like to obtain her lab results.  Requests a return call to [phone]

## 2016-07-07 ENCOUNTER — Other Ambulatory Visit: Payer: Medicaid Other

## 2016-07-08 NOTE — Telephone Encounter (Signed)
Patient no showed appointment on 07/07/16.

## 2016-07-19 NOTE — Telephone Encounter (Signed)
Attempted to call patient to make another appointment so that we could send off a G/C but there was no answer or voicemail to leave a message.

## 2016-07-20 ENCOUNTER — Ambulatory Visit: Payer: Medicaid Other

## 2020-06-29 ENCOUNTER — Other Ambulatory Visit: Payer: Self-pay

## 2020-06-29 ENCOUNTER — Other Ambulatory Visit (HOSPITAL_COMMUNITY)
Admission: RE | Admit: 2020-06-29 | Discharge: 2020-06-29 | Disposition: A | Discharge status: Home / Self Care | Payer: Medicaid Other | Source: Ambulatory Visit | Attending: Nurse Practitioner | Admitting: Nurse Practitioner

## 2020-06-29 ENCOUNTER — Ambulatory Visit (INDEPENDENT_AMBULATORY_CARE_PROVIDER_SITE_OTHER): Payer: Medicaid Other | Admitting: Nurse Practitioner

## 2020-06-29 ENCOUNTER — Encounter: Payer: Self-pay | Admitting: Nurse Practitioner

## 2020-06-29 VITALS — BP 116/79 | HR 88 | Ht 65.0 in | Wt 267.0 lb

## 2020-06-29 DIAGNOSIS — Z01419 Encounter for gynecological examination (general) (routine) without abnormal findings: Secondary | ICD-10-CM | POA: Diagnosis not present

## 2020-06-29 DIAGNOSIS — Z6841 Body Mass Index (BMI) 40.0 and over, adult: Secondary | ICD-10-CM

## 2020-06-29 DIAGNOSIS — Z975 Presence of (intrauterine) contraceptive device: Secondary | ICD-10-CM | POA: Diagnosis not present

## 2020-06-29 DIAGNOSIS — N393 Stress incontinence (female) (male): Secondary | ICD-10-CM | POA: Diagnosis not present

## 2020-06-29 DIAGNOSIS — F17200 Nicotine dependence, unspecified, uncomplicated: Secondary | ICD-10-CM

## 2020-06-29 NOTE — Patient Instructions (Addendum)
[facility] [employer]  Department of Social Services[place] [employer] [address] [place], [place] [address] [phone]   or  www.https://hall.info/ SNAP/EBT/ Other nutritional benefits  [facility] [address] [place], [place], [place] [address] [phone]  or  https://king.net/ **WIC for  women who are pregnant and postpartum, infants and children up to 33 years old  [employer] [facility] [address], [place], [place] [address] [phone]   or   www.theblessedtable.org  **Food pantry  [employer] [address] [place], [place], [place] [address] [phone]   or   https://brotherkolbes.godaddysites.com  **Emergency food and prepared meals  [address] [place] [employer] [facility] [address] [place], [place] [address] [phone]   or   www.cedargrovetop.us **Food pantry  [facility] [address] [place], [place] [address] [phone]   or   www.GolfingFamily.no **Food pantry  [facility] [address], [place], [place] [address] [phone] **Food pantry  [facility] [address], [place], [place] [address] [phone]   or   www.greensborourbanministry.org  Tour manager and prepared meals  [facility] [address] [place], [address] [place], [place], [place] [address] VerifiedMovies.gl  **Food pantry  [employer] [facility] [address] [place], [place] [address] [phone]   or   www.lbcnow.org  **Food pantry  [employer] [address] [place], [place] [address] [phone]   or   WorkingMBA.co.nz **Food pantry, nutrition education, gardening activities  [facility] [address] [place], [place] [address] [phone] **Food pantry  [facility] [employer] [address], [place], [place] [address] [phone]   or   www.salvationarmyof[place].[employer] [facility] [place] [address], [place], [place] [address] [phone]   or   http://senior-resources-[place].org [employer] Food on Wheels Program  [facility] [address] [place], [place] [address] [phone]   or   www.stmattchurch.com  **Food pantry  [facility] [address], [place], [place] [address] [phone]   or   vandaliapresbyterianchurch.org **Food pantry  [employer]  [employer] [facility] [address], [place], [place] [address] [phone]   or   Www.FightingMatch.com.ee *Civil engineer, [employer] [address] [place], [place] [address] [phone] **Food pantry    Kegel Exercises  Kegel exercises can help strengthen your pelvic floor muscles. The pelvic floor is a group of muscles that support your rectum, small intestine, and bladder. In females, pelvic floor muscles also help support the womb (uterus). These muscles help you control the flow of urine and stool. Kegel exercises are painless and simple, and they do not require any equipment. Your provider may suggest Kegel exercises to:  Improve bladder and bowel control.  Improve sexual response.  Improve weak pelvic floor muscles after surgery to remove the uterus (hysterectomy) or pregnancy (females).  Improve weak pelvic floor muscles after prostate gland removal or surgery (males). Kegel exercises involve squeezing your pelvic floor muscles, which are the same muscles you squeeze when you try to stop the flow of urine or keep from passing gas. The exercises can be done while sitting, standing, or lying down, but it is best to vary your  position. Exercises How to do Kegel exercises: 1. Squeeze your pelvic floor muscles tight. You should feel a tight lift in your rectal area. If you  are a female, you should also feel a tightness in your vaginal area. Keep your stomach, buttocks, and legs relaxed. 2. Hold the muscles tight for up to 10 seconds. 3. Breathe normally. 4. Relax your muscles. 5. Repeat as told by your health care provider. Repeat this exercise daily as told by your health care provider. Continue to do this exercise for at least 4-6 weeks, or for as long as told by your health care provider. You may be referred to a physical therapist who can help you learn more about how to do Kegel exercises. Depending on your condition, your health care provider may recommend:  Varying how long you squeeze your muscles.  Doing several sets of exercises every day.  Doing exercises for several weeks.  Making Kegel exercises a part of your regular exercise routine. This information is not intended to replace advice given to you by your health care provider. Make sure you discuss any questions you have with your health care provider. Document Revised: 07/18/2018 Document Reviewed: 07/18/2018 Elsevier Patient Education  2020 [employer].

## 2020-06-29 NOTE — Progress Notes (Signed)
GYNECOLOGY ANNUAL PREVENTATIVE CARE ENCOUNTER NOTE  Subjective:   Jodi Estrada is a 34 y.o. [phone] female here for a routine annual gynecologic exam.  Current complaints: having problems leaking urine - especially when sneezing.  Has done some Kegel exercises but has not noticed any improvement in the leaking of urine.  Wants referral for pelvic muscle evaluation.   Denies abnormal vaginal bleeding, discharge, pelvic pain, problems with intercourse or other gynecologic concerns.    Gynecologic History Patient's last menstrual period was 06/13/2020 (within days). Contraception: IUD Last Pap: 2017. Results were: normal   Obstetric History OB History  Gravida Para Term Preterm AB Living  6 3 3  0 3 3  SAB TAB Ectopic Multiple Live Births  0 2 1 0 3    # Outcome Date GA Lbr Len/2nd Weight Sex Delivery Anes PTL Lv  6 Term 05/11/08    M Vag-Spont  N LIV  5 Term 01/09/06    M Vag-Spont  N LIV  4 Term 05/26/04    F Vag-Spont  N LIV     Birth Comments: low birth wt  3 TAB           2 Ectopic           1 TAB             Past Medical History:  Diagnosis Date  . Abnormal Pap smear    follow up was wnl  . Bacterial vaginosis   . Chlamydia   . Ectopic pregnancy   . Genital herpes   . Trichomonas     Past Surgical History:  Procedure Laterality Date  . DILATION AND CURETTAGE OF UTERUS    . LAPAROSCOPY  01/29/2012   Procedure: LAPAROSCOPY OPERATIVE;  Surgeon: 01/31/2012 C. Hollie Salk, MD;  Location: [facility];  Service: Gynecology;  Laterality: N/A;  operative laparoscopy. Right salpingectomy with removal of ectopic pregnancy    No current outpatient medications on file prior to visit.   No current facility-administered medications on file prior to visit.    No Known Allergies  Social History   Socioeconomic History  . Marital status: Single    Spouse name: Not on file  . Number of children: Not on file  . Years of education: Not on file  . Highest education level: Not on file    Occupational History  . Not on file  Tobacco Use  . Smoking status: Current Every Day Smoker    Packs/day: 0.50    Years: 5.00    Pack years: 2.50    Types: Cigarettes  . Smokeless tobacco: Never Used  Substance and Sexual Activity  . Alcohol use: Yes    Comment: occasionally- social  . Drug use: Yes    Types: Marijuana    Comment: once in a while per patient  . Sexual activity: Yes    Birth control/protection: Condom, Inserts  Other Topics Concern  . Not on file  Social History Narrative  . Not on file   Social Determinants of Health   Financial Resource Strain:   . Difficulty of Paying Living Expenses:   Food Insecurity: Food Insecurity Present  . Worried About Marice Potter in the Last Year: Sometimes true  . Ran Out of Food in the Last Year: Sometimes true  Transportation Needs: Unmet Transportation Needs  . Lack of Transportation (Medical): Yes  . Lack of Transportation (Non-Medical): Yes  Physical Activity:   . Days of Exercise per Week:   .  Minutes of Exercise per Session:   Stress:   . Feeling of Stress :   Social Connections:   . Frequency of Communication with Friends and Family:   . Frequency of Social Gatherings with Friends and Family:   . Attends Religious Services:   . Active Member of Clubs or Organizations:   . Attends Banker Meetings:   Marland Kitchen Marital Status:   Intimate Partner Violence:   . Fear of Current or Ex-Partner:   . Emotionally Abused:   Marland Kitchen Physically Abused:   . Sexually Abused:     Family History  Problem Relation Age of Onset  . Hypertension Maternal Grandmother   . Diabetes Maternal Grandmother   . Anesthesia problems Neg Hx   . Other Neg Hx   . Migraines Sister     The following portions of the patient's history were reviewed and updated as appropriate: allergies, current medications, past family history, past medical history, past social history, past surgical history and problem list.  Review of  Systems Pertinent items noted in HPI and remainder of comprehensive ROS otherwise negative.   Objective:  BP 116/79   Pulse 88   Ht 5\' 5"  (1.651 m)   Wt 267 lb (121.1 kg)   LMP 06/13/2020 (Within Days)   BMI 44.43 kg/m  CONSTITUTIONAL: Well-developed, well-nourished female in no acute distress.  HENT:  Normocephalic, atraumatic, External right and left ear normal.  EYES: Conjunctivae and EOM are normal. Pupils are equal, round.  No scleral icterus.  NECK: Normal range of motion, supple, no masses.  Normal thyroid.  SKIN: Skin is warm and dry. No rash noted. Not diaphoretic. No erythema. No pallor. NEUROLOGIC: Alert and oriented to person, place, and time. Normal reflexes, muscle tone coordination. No cranial nerve deficit noted. PSYCHIATRIC: Normal mood and affect. Normal behavior. Normal judgment and thought content. CARDIOVASCULAR: Normal heart rate noted, regular rhythm RESPIRATORY: Clear to auscultation bilaterally. Effort and breath sounds normal, no problems with respiration noted. BREASTS: Symmetric in size. No masses, skin changes, nipple drainage, or lymphadenopathy. ABDOMEN: Soft, no distention noted.  No tenderness, rebound or guarding.  PELVIC: Normal appearing external genitalia; normal appearing vaginal mucosa and cervix.  No abnormal discharge noted.  Pap smear obtained.  Normal uterine size, no other palpable masses, no uterine or adnexal tenderness.  IUD strings visualized at os 1 cm in length. MUSCULOSKELETAL: Normal range of motion. No tenderness.  No cyanosis, clubbing, or edema.    Assessment and Plan:  1. Well woman exam with routine gynecological exam Feeling good.  BP normal.  - Cytology - PAP( [place])  2. BMI 40.0-44.9, adult (HCC) Weight loss encouraged. Drink at least 8 8-oz glasses of water every day.  3. Presence of 52 mg levonorgestrel-releasing intrauterine device (IUD) Placed in 2017 - plan to keep for 6 years and possibly 7 if FDA approvals  are completed. Does not plan to have more children.  Would replace IUD at end of this period of use.  Three day manageable menses.  4. Stress incontinence of urine Referral to PT in this office for evaluation and treatment  5. Current smoker Advised to stop all smoking.  Reviewed long term consequences for smoking. Client has tried to stop smoking before.  Is interested but declines appointment to pursue further smoking cessation at this time.  Will follow up results of pap smear and manage accordingly. Routine preventative health maintenance measures emphasized. Please refer to After Visit Summary for other counseling recommendations.  Nolene Bernheim, RN, MSN, NP-BC Nurse Practitioner, [facility]

## 2020-07-01 LAB — CYTOLOGY - PAP
Chlamydia: POSITIVE — AB
Comment: NEGATIVE
Comment: NEGATIVE
Comment: NEGATIVE
Comment: NORMAL
Diagnosis: NEGATIVE
High risk HPV: NEGATIVE
Neisseria Gonorrhea: NEGATIVE
Trichomonas: NEGATIVE

## 2020-07-03 ENCOUNTER — Telehealth (INDEPENDENT_AMBULATORY_CARE_PROVIDER_SITE_OTHER): Payer: Medicaid Other

## 2020-07-03 DIAGNOSIS — A749 Chlamydial infection, unspecified: Secondary | ICD-10-CM

## 2020-07-03 MED ORDER — DOXYCYCLINE HYCLATE 100 MG PO CAPS: 100.0000 mg | ORAL_CAPSULE | Freq: Two times a day (BID) | ORAL | 0 refills | Status: DC

## 2020-07-03 NOTE — Telephone Encounter (Signed)
Called pt with positive Chlamydia results; VM left stating pt may call the office with results and informed patient she will have a letter in the mail with results. Callback number given. Letter sent. Doxycyline sent to pt's pharmacy per protocol. Front office notified to schedule TOC appt in 4 weeks.

## 2020-07-06 ENCOUNTER — Telehealth (INDEPENDENT_AMBULATORY_CARE_PROVIDER_SITE_OTHER): Payer: Medicaid Other | Admitting: *Deleted

## 2020-07-06 DIAGNOSIS — A749 Chlamydial infection, unspecified: Secondary | ICD-10-CM

## 2020-07-06 NOTE — Telephone Encounter (Signed)
Pt left VM message stating she is calling for test results.

## 2020-07-06 NOTE — Telephone Encounter (Signed)
Pt called and spoke with front office staff; states she is returning call. Called pt back because I was not available when pt called initially. Positive Chlamydia result given. Explained that pt should take medication as prescribed that has been sent to pharmacy. Instructed pt to notify all recent partners of need to get tested and for pt to abstain from intercourse for 2 weeks following end of treatment. If partner is treated later, explained she should wait for 2 weeks following their end of treatment. Explained the front office will reach out with nurse visit for self-swab appt approx. 4 weeks away.

## 2020-07-07 NOTE — Telephone Encounter (Signed)
Called pt; all concerns regarding results addressed in prior telephone encounter per pt. Pt states doxycycline is causing nausea. Encouraged pt to eat a meal prior to taking this medication so that she is not taking this on an empty stomach. Pt to follow up with office as needed.

## 2020-07-30 ENCOUNTER — Other Ambulatory Visit: Payer: Self-pay

## 2020-07-30 ENCOUNTER — Ambulatory Visit: Payer: Medicaid Other

## 2020-07-30 ENCOUNTER — Ambulatory Visit (INDEPENDENT_AMBULATORY_CARE_PROVIDER_SITE_OTHER): Payer: Medicaid Other | Admitting: *Deleted

## 2020-07-30 ENCOUNTER — Encounter: Payer: Self-pay | Admitting: *Deleted

## 2020-07-30 ENCOUNTER — Other Ambulatory Visit (HOSPITAL_COMMUNITY)
Admission: RE | Admit: 2020-07-30 | Discharge: 2020-07-30 | Disposition: A | Discharge status: Home / Self Care | Payer: Medicaid Other | Source: Ambulatory Visit | Attending: Obstetrics and Gynecology | Admitting: Obstetrics and Gynecology

## 2020-07-30 VITALS — BP 120/82 | HR 66 | Ht 65.0 in | Wt 260.7 lb

## 2020-07-30 DIAGNOSIS — Z8619 Personal history of other infectious and parasitic diseases: Secondary | ICD-10-CM | POA: Diagnosis not present

## 2020-07-30 NOTE — Progress Notes (Signed)
Pt presents for test of cure following +Chlamydia infection on Pap 7/19. She reports that she took medication as prescribed and had no problems. She denies having vaginal discharge or irritation @ present however desires testing for BV and yeast as well. She has not had intercourse since her treatment. Self swab was obtained and pt was advised that she will be notified of results. Pt was encouraged to sign up for Mychart. Pt's screening identified food insecurity today - food banks in [place] discussed and pt will check information on-line.  She also identified transportation difficulties and will sign up for transportation service upon check out. Pt voiced understanding of all information and instructions given today.

## 2020-08-03 LAB — CERVICOVAGINAL ANCILLARY ONLY
Bacterial Vaginitis (gardnerella): POSITIVE — AB
Candida Glabrata: NEGATIVE
Candida Vaginitis: NEGATIVE
Chlamydia: NEGATIVE
Comment: NEGATIVE
Comment: NEGATIVE
Comment: NEGATIVE
Comment: NEGATIVE
Comment: NORMAL
Neisseria Gonorrhea: NEGATIVE

## 2020-08-03 MED ORDER — METRONIDAZOLE 500 MG PO TABS: 500.0000 mg | ORAL_TABLET | Freq: Two times a day (BID) | ORAL | 0 refills | Status: DC

## 2020-08-03 NOTE — Addendum Note (Signed)
Addended by: Leroy Libman on: 08/03/2020 02:21 PM   Modules accepted: Orders

## 2020-08-03 NOTE — Progress Notes (Signed)
I have reviewed this chart and agree with the RN/CMA assessment and management.    K. Meryl Kratos Ruscitti, M.D. Attending [facility] (Faculty Practice)   

## 2020-08-04 ENCOUNTER — Telehealth: Payer: Self-pay

## 2020-08-04 NOTE — Telephone Encounter (Signed)
-----   Message from Conan Bowens, MD sent at 08/03/2020  2:21 PM EDT ----- Positive BV, please let patient know, script sent to pharmacy [facility] neg for chlamdyia

## 2020-08-04 NOTE — Telephone Encounter (Signed)
Called Pt to advise of test results, advised that she tested + for BV, explained that Rx was sent to pharmacy. Pt verbalized understanding.

## 2020-09-01 ENCOUNTER — Encounter: Payer: Self-pay | Admitting: Physical Therapy

## 2020-09-01 ENCOUNTER — Other Ambulatory Visit: Payer: Self-pay

## 2020-09-01 ENCOUNTER — Encounter: Payer: Medicaid Other | Attending: Nurse Practitioner | Admitting: Physical Therapy

## 2020-09-01 DIAGNOSIS — N393 Stress incontinence (female) (male): Secondary | ICD-10-CM | POA: Diagnosis present

## 2020-09-01 DIAGNOSIS — R278 Other lack of coordination: Secondary | ICD-10-CM | POA: Diagnosis present

## 2020-09-01 DIAGNOSIS — M6281 Muscle weakness (generalized): Secondary | ICD-10-CM | POA: Diagnosis present

## 2020-09-01 NOTE — Patient Instructions (Signed)
Access Code: [email or web address] URL: [email or web address] Date: 09/01/2020 Prepared by: Eulis Foster  Exercises Seated Pelvic Floor Contraction - 4 x daily - 7 x weekly - 3 sets - 10 reps - 5 sec hold Eulis Foster, PT [facility] [address] [place], [place] [address] W: [phone] [email or web address]

## 2020-09-01 NOTE — Therapy (Signed)
[facility] [address], [address] [place], [place], [address] Phone: [phone]   Fax:  [phone]  Physical Therapy Treatment  Patient Details  Name: Jodi Estrada MRN: [id number] Date of Birth: 03/03/1986 Referring Provider (PT): Nolene Bernheim, NP   Encounter Date: 09/01/2020   PT End of Session - 09/01/20 1342    Visit Number 1    Date for PT Re-Evaluation 11/24/20    Authorization Type Medicaid UHC    Authorization - Visit Number 1    Authorization - Number of Visits 27    PT Start Time 1320    PT Stop Time 1405    PT Time Calculation (min) 45 min    Activity Tolerance Patient tolerated treatment well    Behavior During Therapy [facility] for tasks assessed/performed           Past Medical History:  Diagnosis Date  . Abnormal Pap smear    follow up was wnl  . Bacterial vaginosis   . Chlamydia   . Ectopic pregnancy   . Genital herpes   . Trichomonas     Past Surgical History:  Procedure Laterality Date  . DILATION AND CURETTAGE OF UTERUS    . LAPAROSCOPY  01/29/2012   Procedure: LAPAROSCOPY OPERATIVE;  Surgeon: Hollie Salk C. Marice Potter, MD;  Location: [facility];  Service: Gynecology;  Laterality: N/A;  operative laparoscopy. Right salpingectomy with removal of ectopic pregnancy    There were no vitals filed for this visit.   Subjective Assessment - 09/01/20 1323    Subjective Patient reports her urinary leakage started 2 years ago with coughing. Patient has wet herself.    Patient Stated Goals Stop the urinary leakage    Currently in Pain? No/denies    Multiple Pain Sites No              [facility] PT Assessment - 09/01/20 0001      Assessment   Medical Diagnosis N39.3 Stress incontinence of urine    Referring Provider (PT) Nolene Bernheim, NP    Onset Date/Surgical Date 09/01/18    Prior Therapy none      Precautions   Precautions None      Restrictions   Weight Bearing Restrictions No      Balance Screen    Has the patient fallen in the past 6 months No    Has the patient had a decrease in activity level because of a fear of falling?  No    Is the patient reluctant to leave their home because of a fear of falling?  No      Home Tourist information centre manager residence      Prior Function   Level of Independence Independent    Vocation Full time employment    Vocation Requirements mail clerk, standing, 8 hour shift      Cognition   Overall Cognitive Status Within Functional Limits for tasks assessed      Posture/Postural Control   Posture/Postural Control No significant limitations      ROM / Strength   AROM / PROM / Strength AROM;PROM;Strength      AROM   Overall AROM Comments lumbar ROM is full      PROM   Right Hip Internal Rotation  10    Left Hip Internal Rotation  15      Strength   Right Hip ABduction 4+/5    Left Hip ABduction 4/5      Palpation  Spinal mobility decreased lower rib cage mobility    SI assessment  left ilium is rotated posteriorly                      Pelvic Floor Special Questions - 09/01/20 0001    Prior Pregnancies Yes    Number of Vaginal Deliveries 3    Episiotomy Performed No   tore 2 times   Currently Sexually Active No    Urinary Leakage Yes    Pad use 2 pads during the day , thin pads    Activities that cause leaking Coughing;Sneezing;Other    Other activities that cause leaking yelling, walking from parking lot to apartment, walking to commode    Urinary urgency Yes   gets to tthe commode most of time   Fecal incontinence No    Fluid intake water, fruit drinks, alot of tea    Falling out feeling (prolapse) No    Skin Integrity Intact    Prolapse None    Pelvic Floor Internal Exam Patient confirms identifcation and approves PT to assess pelvic floor and treatment    Exam Type Vaginal    Palpation tightness at perineal body, Decreased contraction laterally ; when coughs will push therapist finger out; when  contracts pelvic floor and soughs will not push therapist finger out    Strength weak squeeze, no lift    Strength # of seconds 5                     PT Education - 09/01/20 1407    Education Details Access Code: [email or web address]    Person(s) Educated Patient    Methods Explanation;Demonstration;Verbal cues;Handout    Comprehension Returned demonstration;Verbalized understanding            PT Short Term Goals - 09/01/20 1415      PT SHORT TERM GOAL #1   Title independent with intial HEP    Time 4    Period Weeks    Status New    Target Date 09/29/20             PT Long Term Goals - 09/01/20 1415      PT LONG TERM GOAL #1   Title independent with advanced HEP    Baseline not educated yet    Time 12    Period Weeks    Status New    Target Date 11/24/20      PT LONG TERM GOAL #2   Title able to cough, sneeze, laugh, yell without urinary leakage due to increased strength >/= 3/5    Baseline strength is 2/5, pushes therapist finger out when she coughs    Time 12    Period Weeks    Target Date 11/24/20      PT LONG TERM GOAL #3   Title able to walk from parking lot to her apartment without leaking urine due to increased strength and pressure management    Baseline leaks urine as she walks from her car to her apartment    Time 12    Period Weeks    Status New    Target Date 11/24/20      PT LONG TERM GOAL #4   Title able to contract her abdominals with a pelvic floor contraction to improve pelvic floor coordination    Baseline not able to contract her abdominals    Time 12    Period Weeks    Status New    Target  Date 11/24/20                 Plan - 09/01/20 1408    Clinical Impression Statement Patient is a 34 year old female with stress urinary incontinence that started suddenly 2 years ago. Patient wears 1 pad per day. She leaks urine with yelling, coughing laughing, and walking from the car to her apartment. Pelvic floor strength is 2/5 with  decreased contraction laterally. She will push the therapist finger out of the vagina wiht a cough but if she tightens her pelvic floor prior to cough she will not push the finger out. Abdominal strength is 2/5. Bilateral hip abduction strength is 4/5. Decreased movement of the lower rib cage. Patient will benefit from skilled therapy to improve pelvic floor strength and coordination to reduce urinary leakage.    Personal Factors and Comorbidities Age    Examination-Activity Limitations Continence    Stability/Clinical Decision Making Stable/Uncomplicated    Clinical Decision Making Low    Rehab Potential Excellent    PT Frequency 1x / week    PT Duration 12 weeks    PT Treatment/Interventions Biofeedback;Therapeutic activities;Therapeutic exercise;Neuromuscular re-education;Patient/family education;Manual techniques;Dry needling    PT Next Visit Plan abdominal contraction; improve diaphragmatic breathing and lower rib cage mobiltiy; pressure management, contracting pelvic floor prior to activity    PT Home Exercise Plan Access Code: [email or web address]    Consulted and Agree with Plan of Care Patient           Patient will benefit from skilled therapeutic intervention in order to improve the following deficits and impairments:  Decreased endurance, Decreased activity tolerance, Decreased strength, Increased fascial restricitons, Decreased coordination  Visit Diagnosis: Muscle weakness (generalized) - Plan: PT plan of care cert/re-cert  Other lack of coordination - Plan: PT plan of care cert/re-cert  Stress incontinence (female) (female) - Plan: PT plan of care cert/re-cert     Problem List Patient Active Problem List   Diagnosis Date Noted  . Current every day smoker 06/29/2020  . Presence of 52 mg levonorgestrel-releasing intrauterine device (IUD) 02/11/2016  . History of ectopic pregnancy 04/25/2012  . History of abnormal Pap smear 04/25/2012  . Genital herpes 04/25/2012    Eulis Foster,  PT 09/01/20 2:21 PM   [facility] [address] [place], [place], [address] Phone: [phone]   Fax:  [phone]  Name: Jodi Estrada MRN: [id number] Date of Birth: 09-Nov-1986

## 2020-09-08 ENCOUNTER — Encounter: Payer: Self-pay | Admitting: Physical Therapy

## 2020-09-08 ENCOUNTER — Encounter: Payer: Medicaid Other | Admitting: Physical Therapy

## 2020-09-08 ENCOUNTER — Other Ambulatory Visit: Payer: Self-pay

## 2020-09-08 DIAGNOSIS — M6281 Muscle weakness (generalized): Secondary | ICD-10-CM | POA: Diagnosis not present

## 2020-09-08 DIAGNOSIS — R278 Other lack of coordination: Secondary | ICD-10-CM

## 2020-09-08 DIAGNOSIS — N393 Stress incontinence (female) (male): Secondary | ICD-10-CM

## 2020-09-08 NOTE — Patient Instructions (Addendum)
Certain foods and liquids will decrease the pH making the urine more acidic.  Urinary urgency increases when the urine has a low pH.  Most common irritants: alcohol, carbonated beverages and caffinated beverages, citrus drinks  Foods to avoid: apple juice, apples, ascorbic acid, canteloupes, chili, citrus fruits, coffee, cranberries, grapes, guava, peaches, pepper, pineapple, plums, strawberries, tea, tomatoes, and vinegar.  Drinking plenty of water may help to increase the pH and dilute out any of the effects of specific irritants.  Foods that are NOT irritating to the bladder include: Pears, papayas, sun-brewed teas, watermelons, non-citrus herbal teas, apricots, kava and low-acid instant drinks (Postum)  Access Code: [email or web address] URL: [email or web address] Date: 09/08/2020 Prepared by: Eulis Foster  Exercises Seated Pelvic Floor Contraction - 4 x daily - 7 x weekly - 3 sets - 10 reps - 5 sec hold Quadruped Alternating Shoulder Flexion - 1 x daily - 7 x weekly - 2 sets - 10 reps Beginner Front Arm Support - 1 x daily - 7 x weekly - 2 sets - 10 reps Eulis Foster, PT [facility] [address], [address] [place], [place] [address] W: [phone] [email or web address]

## 2020-09-08 NOTE — Therapy (Signed)
[facility] at [facility] [address], [address] [place], [place], [address] Phone: [phone]   Fax:  [phone]  Physical Therapy Treatment  Patient Details  Name: Jodi Estrada MRN: [id number] Date of Birth: 05/30/1986 Referring Provider (PT): Nolene Bernheim, NP   Encounter Date: 09/08/2020   PT End of Session - 09/08/20 1347    Visit Number 2    Date for PT Re-Evaluation 11/24/20    Authorization Type Medicaid UHC    Authorization - Visit Number 2    Authorization - Number of Visits 27    PT Start Time 1300    PT Stop Time 1345    PT Time Calculation (min) 45 min    Activity Tolerance Patient tolerated treatment well    Behavior During Therapy WFL for tasks assessed/performed           Past Medical History:  Diagnosis Date   Abnormal Pap smear    follow up was wnl   Bacterial vaginosis    Chlamydia    Ectopic pregnancy    Genital herpes    Trichomonas     Past Surgical History:  Procedure Laterality Date   DILATION AND CURETTAGE OF UTERUS     LAPAROSCOPY  01/29/2012   Procedure: LAPAROSCOPY OPERATIVE;  Surgeon: Leanora Ivanoff. Marice Potter, MD;  Location: [facility];  Service: Gynecology;  Laterality: N/A;  operative laparoscopy. Right salpingectomy with removal of ectopic pregnancy    There were no vitals filed for this visit.   Subjective Assessment - 09/08/20 1305    Subjective I have not drank any tea. Pads sre a little dryer. Saturday I did not have leakage and I paid attention to what I eat and drink.    Patient Stated Goals Stop the urinary leakage    Currently in Pain? No/denies                             [facility] Adult PT Treatment/Exercise - 09/08/20 0001      Self-Care   Self-Care Other Self-Care Comments    Other Self-Care Comments  education on bladder irritants and how they affect the bladder      Lumbar Exercises: Standing   Other Standing Lumbar Exercises squat with bil. shoulder  extension with red band; push forward with red band and lunge forward like she is pushing a box    Other Standing Lumbar Exercises lunge with pulling red band across body 15 times each side      Lumbar Exercises: Supine   Ab Set 10 reps;5 seconds    AB Set Limitations tactile cues to flatten back, used ball squeeze and pelvic floor contraction      Lumbar Exercises: Quadruped   Single Arm Raise Right;Left;10 reps;1 second    Single Arm Raises Limitations TA contraction, ball squeeze, pelvic floor contraction    Straight Leg Raise 10 reps;1 second    Straight Leg Raises Limitations each leg with trunk control and not leaning over to one side    Other Quadruped Lumbar Exercises tansverse abdominus contraction iin quadruped with ball squeeze 10x 5 sec                  PT Education - 09/08/20 1346    Education Details Access Code: [email or web address]; education on bladder irritants    Person(s) Educated Patient    Methods Explanation;Demonstration;Verbal cues;Handout    Comprehension Returned demonstration;Verbalized understanding  PT Short Term Goals - 09/01/20 1415      PT SHORT TERM GOAL #1   Title independent with intial HEP    Time 4    Period Weeks    Status New    Target Date 09/29/20             PT Long Term Goals - 09/01/20 1415      PT LONG TERM GOAL #1   Title independent with advanced HEP    Baseline not educated yet    Time 12    Period Weeks    Status New    Target Date 11/24/20      PT LONG TERM GOAL #2   Title able to cough, sneeze, laugh, yell without urinary leakage due to increased strength >/= 3/5    Baseline strength is 2/5, pushes therapist finger out when she coughs    Time 12    Period Weeks    Target Date 11/24/20      PT LONG TERM GOAL #3   Title able to walk from parking lot to her apartment without leaking urine due to increased strength and pressure management    Baseline leaks urine as she walks from her car to her apartment     Time 12    Period Weeks    Status New    Target Date 11/24/20      PT LONG TERM GOAL #4   Title able to contract her abdominals with a pelvic floor contraction to improve pelvic floor coordination    Baseline not able to contract her abdominals    Time 12    Period Weeks    Status New    Target Date 11/24/20                 Plan - 09/08/20 1348    Clinical Impression Statement Patient feels her pads are dryer with doing the pelvic floor exercise. Patient contracts the TA better in quadruped than supine. Patient is learning to breath out during the hard part of an activity. Patient is able to breath inot her abdomen and expand it now. Patient is doing exercises with resistance that will be added next visit. Patient will benefit from skilled therapy to improve pelvic floor strength and coordination to reduce urinary leakage.    Personal Factors and Comorbidities Age    Examination-Activity Limitations Continence    Stability/Clinical Decision Making Stable/Uncomplicated    Rehab Potential Excellent    PT Frequency 1x / week    PT Duration 12 weeks    PT Treatment/Interventions Biofeedback;Therapeutic activities;Therapeutic exercise;Neuromuscular re-education;Patient/family education;Manual techniques;Dry needling    PT Next Visit Plan pressure management, contracting pelvic floor prior to activity; HEP with bands as she was doing last visit; lifting items    PT Home Exercise Plan Access Code: [email or web address]    Recommended Other Services Md signed initial eval    Consulted and Agree with Plan of Care Patient           Patient will benefit from skilled therapeutic intervention in order to improve the following deficits and impairments:  Decreased endurance, Decreased activity tolerance, Decreased strength, Increased fascial restricitons, Decreased coordination  Visit Diagnosis: Muscle weakness (generalized)  Other lack of coordination  Stress incontinence (female)  (female)     Problem List Patient Active Problem List   Diagnosis Date Noted   Current every day smoker 06/29/2020   Presence of 52 mg levonorgestrel-releasing intrauterine device (IUD) 02/11/2016   History of  ectopic pregnancy 04/25/2012   History of abnormal Pap smear 04/25/2012   Genital herpes 04/25/2012    Eulis Foster, PT 09/08/20 1:52 PM   [place] [facility] [address], [address] [place], [place], [address] Phone: [phone]   Fax:  [phone]  Name: Jodi Estrada MRN: [id number] Date of Birth: 20-Jul-1986

## 2020-09-15 ENCOUNTER — Encounter: Payer: Medicaid Other | Attending: Nurse Practitioner | Admitting: Physical Therapy

## 2020-09-15 ENCOUNTER — Other Ambulatory Visit: Payer: Self-pay

## 2020-09-15 ENCOUNTER — Encounter: Payer: Self-pay | Admitting: Physical Therapy

## 2020-09-15 DIAGNOSIS — R278 Other lack of coordination: Secondary | ICD-10-CM | POA: Insufficient documentation

## 2020-09-15 DIAGNOSIS — N393 Stress incontinence (female) (male): Secondary | ICD-10-CM | POA: Diagnosis present

## 2020-09-15 DIAGNOSIS — M6281 Muscle weakness (generalized): Secondary | ICD-10-CM | POA: Insufficient documentation

## 2020-09-15 NOTE — Patient Instructions (Signed)
Access Code: [email or web address] URL: [email or web address] Date: 09/15/2020 Prepared by: Eulis Foster  Exercises Deadlift with Resistance - 1 x daily - 7 x weekly - 2 sets - 10 reps Standing Overhead Press with Bar - 1 x daily - 7 x weekly - 2 sets - 10 reps Split Stance Single Arm Chest Press with Resistance - 1 x daily - 7 x weekly - 3 sets - 10 reps Half Kneeling Anti-Rotation Press - Forward Leg Anchor Side - 1 x daily - 7 x weekly - 3 sets - 10 reps Eulis Foster, PT [facility] [address], [address] [place], [place] [address] W: [phone] [email or web address]

## 2020-09-15 NOTE — Therapy (Signed)
[facility] at [facility] [address], [address] [place], [place], [address] Phone: [phone]   Fax:  [phone]  Physical Therapy Treatment  Patient Details  Name: Jodi Estrada MRN: [id number] Date of Birth: 1986/09/01 Referring Provider (PT): Nolene Bernheim, NP   Encounter Date: 09/15/2020   PT End of Session - 09/15/20 1348    Visit Number 3    Date for PT Re-Evaluation 11/24/20    Authorization Type Medicaid UHC    Authorization - Visit Number 3    Authorization - Number of Visits 27    PT Start Time 1300    PT Stop Time 1340    PT Time Calculation (min) 40 min    Activity Tolerance Patient tolerated treatment well    Behavior During Therapy [facility] for tasks assessed/performed           Past Medical History:  Diagnosis Date  . Abnormal Pap smear    follow up was wnl  . Bacterial vaginosis   . Chlamydia   . Ectopic pregnancy   . Genital herpes   . Trichomonas     Past Surgical History:  Procedure Laterality Date  . DILATION AND CURETTAGE OF UTERUS    . LAPAROSCOPY  01/29/2012   Procedure: LAPAROSCOPY OPERATIVE;  Surgeon: Hollie Salk C. Marice Potter, MD;  Location: [facility];  Service: Gynecology;  Laterality: N/A;  operative laparoscopy. Right salpingectomy with removal of ectopic pregnancy    There were no vitals filed for this visit.   Subjective Assessment - 09/15/20 1302    Subjective I was able to make it to the bathroom when I had a strong urge and was able to move to deter the leakage. My pad has stayed dry all week. Today is the first day I am not wearing a pad.    Patient Stated Goals Stop the urinary leakage    Currently in Pain? No/denies                             [facility] Adult PT Treatment/Exercise - 09/15/20 0001      Lumbar Exercises: Standing   Lifting Limitations Dead lift with green band 20x with tactile cues to keep shoulders retracted    Other Standing Lumbar Exercises squat into stand overhead  press with green band and pelvic floro contraction 20x;  push forward with green band and lunge forward like she is pushing a box    Other Standing Lumbar Exercises 1/2 knee with moving band back and forth toward the knee on the floor with green band                  PT Education - 09/15/20 1348    Education Details Access Code: [email or web address]    Person(s) Educated Patient    Methods Explanation;Demonstration;Verbal cues;Handout    Comprehension Returned demonstration;Verbalized understanding            PT Short Term Goals - 09/15/20 1352      PT SHORT TERM GOAL #1   Title independent with intial HEP    Time 4    Period Weeks    Status Achieved             PT Long Term Goals - 09/01/20 1415      PT LONG TERM GOAL #1   Title independent with advanced HEP    Baseline not educated yet    Time 12    Period Weeks  Status New    Target Date 11/24/20      PT LONG TERM GOAL #2   Title able to cough, sneeze, laugh, yell without urinary leakage due to increased strength >/= 3/5    Baseline strength is 2/5, pushes therapist finger out when she coughs    Time 12    Period Weeks    Target Date 11/24/20      PT LONG TERM GOAL #3   Title able to walk from parking lot to her apartment without leaking urine due to increased strength and pressure management    Baseline leaks urine as she walks from her car to her apartment    Time 12    Period Weeks    Status New    Target Date 11/24/20      PT LONG TERM GOAL #4   Title able to contract her abdominals with a pelvic floor contraction to improve pelvic floor coordination    Baseline not able to contract her abdominals    Time 12    Period Weeks    Status New    Target Date 11/24/20                 Plan - 09/15/20 1349    Clinical Impression Statement Patient has not leaked urine on her pad since last visit. She was able to deter a strong urge while waiting for the bathroom but it was difficult. Patient has  difficulty with remebering to breath with lifting and pushing at work. Patient has difficulty with leakage when she coughs. Patient is able to progress her exercises today. Patient will benefit from skilled therapy to improve pelvic floor  strength and coordination to reduce urinary leakage.    Personal Factors and Comorbidities Age    Examination-Activity Limitations Continence    Stability/Clinical Decision Making Stable/Uncomplicated    Rehab Potential Excellent    PT Frequency 1x / week    PT Duration 12 weeks    PT Treatment/Interventions Biofeedback;Therapeutic activities;Therapeutic exercise;Neuromuscular re-education;Patient/family education;Manual techniques;Dry needling    PT Next Visit Plan pressure management, cough wiht pelvic floro contraction, go over past HEP; work on hip abduction strength; check rib movement    PT Home Exercise Plan Access Code: [email or web address]    Consulted and Agree with Plan of Care Patient           Patient will benefit from skilled therapeutic intervention in order to improve the following deficits and impairments:  Decreased endurance, Decreased activity tolerance, Decreased strength, Increased fascial restricitons, Decreased coordination  Visit Diagnosis: Muscle weakness (generalized)  Other lack of coordination  Stress incontinence (female) (female)     Problem List Patient Active Problem List   Diagnosis Date Noted  . Current every day smoker 06/29/2020  . Presence of 52 mg levonorgestrel-releasing intrauterine device (IUD) 02/11/2016  . History of ectopic pregnancy 04/25/2012  . History of abnormal Pap smear 04/25/2012  . Genital herpes 04/25/2012    Eulis Foster, PT 09/15/20 1:53 PM   [place] [facility] at [facility] [address], [address] [place], [place], [address] Phone: [phone]   Fax:  [phone]  Name: Jodi Estrada MRN: [id number] Date of Birth: Aug 06, 1986

## 2020-09-22 ENCOUNTER — Encounter: Payer: Self-pay | Admitting: Physical Therapy

## 2020-09-22 ENCOUNTER — Encounter: Payer: Medicaid Other | Admitting: Physical Therapy

## 2020-09-22 ENCOUNTER — Other Ambulatory Visit: Payer: Self-pay

## 2020-09-22 DIAGNOSIS — R278 Other lack of coordination: Secondary | ICD-10-CM

## 2020-09-22 DIAGNOSIS — M6281 Muscle weakness (generalized): Secondary | ICD-10-CM | POA: Diagnosis not present

## 2020-09-22 DIAGNOSIS — N393 Stress incontinence (female) (male): Secondary | ICD-10-CM

## 2020-09-22 NOTE — Therapy (Signed)
[facility] at [facility] [address] [place], [place], [address] Phone: [phone]   Fax:  [phone]  Physical Therapy Treatment  Patient Details  Name: Jodi Estrada MRN: [id number] Date of Birth: 05-08-1986 Referring Provider (PT): Nolene Bernheim, NP   Encounter Date: 09/22/2020   PT End of Session - 09/22/20 1343    Visit Number 4    Date for PT Re-Evaluation 11/24/20    Authorization Type Medicaid UHC    Authorization - Visit Number 4    Authorization - Number of Visits 27    PT Start Time 1300    PT Stop Time 1340    PT Time Calculation (min) 40 min    Activity Tolerance Patient tolerated treatment well    Behavior During Therapy WFL for tasks assessed/performed           Past Medical History:  Diagnosis Date   Abnormal Pap smear    follow up was wnl   Bacterial vaginosis    Chlamydia    Ectopic pregnancy    Genital herpes    Trichomonas     Past Surgical History:  Procedure Laterality Date   DILATION AND CURETTAGE OF UTERUS     LAPAROSCOPY  01/29/2012   Procedure: LAPAROSCOPY OPERATIVE;  Surgeon: Leanora Ivanoff. Marice Potter, MD;  Location: [facility];  Service: Gynecology;  Laterality: N/A;  operative laparoscopy. Right salpingectomy with removal of ectopic pregnancy    There were no vitals filed for this visit.   Subjective Assessment - 09/22/20 1303    Subjective I had some leakage over the weekend. It was a big leakage when I was yelling and lifting at the same time.    Patient Stated Goals Stop the urinary leakage    Currently in Pain? No/denies              [facility] PT Assessment - 09/22/20 0001      Palpation   SI assessment  ASIS is equal                          [facility] Adult PT Treatment/Exercise - 09/22/20 0001      Neuro Re-ed    Neuro Re-ed Details  diaphragmatic breathing to elongate the pelvic floor and expand the lower rib cage      Lumbar Exercises: Supine   Ab Set 20  reps;1 second    AB Set Limitations at first just contracting the lower abdomen then using ball squeeze to engage the pelvic floor and lower abdominals    Other Supine Lumbar Exercises pelvic floro contraction with ball squeeze alternate shoulder flexion 20x;       Lumbar Exercises: Sidelying   Clam Right;Left;15 reps;1 second   reverse   Hip Abduction Right;Left;10 reps;1 second   2 sets     Lumbar Exercises: Quadruped   Opposite Arm/Leg Raise Right arm/Left leg;Left arm/Right leg;10 reps    Opposite Arm/Leg Raise Limitations with pelvic floor contraction                  PT Education - 09/22/20 1342    Education Details Access Code: [email or web address]    Person(s) Educated Patient    Methods Explanation;Demonstration;Verbal cues;Handout    Comprehension Returned demonstration;Verbalized understanding            PT Short Term Goals - 09/15/20 1352      PT SHORT TERM GOAL #1   Title independent with intial HEP  Time 4    Period Weeks    Status Achieved             PT Long Term Goals - 09/22/20 1344      PT LONG TERM GOAL #1   Title independent with advanced HEP    Baseline continously learning as she progresses    Time 12    Period Weeks    Status On-going      PT LONG TERM GOAL #2   Title able to cough, sneeze, laugh, yell without urinary leakage due to increased strength >/= 3/5    Baseline strength is 2/5, pushes therapist finger out when she coughs    Time 12    Period Weeks    Status On-going      PT LONG TERM GOAL #3   Title able to walk from parking lot to her apartment without leaking urine due to increased strength and pressure management    Baseline has not leaked while walking from car to apartment    Time 12    Period Weeks    Status On-going      PT LONG TERM GOAL #4   Title able to contract her abdominals with a pelvic floor contraction to improve pelvic floor coordination    Baseline able to do it today    Time 12    Period Weeks     Status Achieved                 Plan - 09/22/20 1345    Clinical Impression Statement Patient has not leaked urine as she is walking from the car to her apartment since she started therapy. Patinet had a big leak one time when she was lifting something with a full bladder. Patient is able to expand her lower rib cage with breath. She is able to contract her lower abdominals with a pelvic floor contraction. Patient still has difficulty with holding her urine as she is walking to the bathroom when she has an urge. Patient will benefit from skilled therapy to improve pelvic floor strength and coordination to reduce urinary leakage.    Personal Factors and Comorbidities Age    Examination-Activity Limitations Continence    Stability/Clinical Decision Making Stable/Uncomplicated    Rehab Potential Excellent    PT Frequency 1x / week    PT Duration 12 weeks    PT Treatment/Interventions Biofeedback;Therapeutic activities;Therapeutic exercise;Neuromuscular re-education;Patient/family education;Manual techniques;Dry needling    PT Next Visit Plan assess the pelvic floor manually and see if the tissue is moving correctly, add supine dead bug for abdominal strength    PT Home Exercise Plan Access Code: [email or web address]    Consulted and Agree with Plan of Care Patient           Patient will benefit from skilled therapeutic intervention in order to improve the following deficits and impairments:  Decreased endurance, Decreased activity tolerance, Decreased strength, Increased fascial restricitons, Decreased coordination  Visit Diagnosis: Muscle weakness (generalized)  Other lack of coordination  Stress incontinence (female) (female)     Problem List Patient Active Problem List   Diagnosis Date Noted   Current every day smoker 06/29/2020   Presence of 52 mg levonorgestrel-releasing intrauterine device (IUD) 02/11/2016   History of ectopic pregnancy 04/25/2012   History of abnormal Pap  smear 04/25/2012   Genital herpes 04/25/2012    Eulis Foster, PT 09/22/20 1:50 PM   [place] [facility] [address] [place],  [place], [address] Phone: [phone]   Fax:  [phone]  Name: Jodi Estrada MRN: [id number] Date of Birth: 1986/06/03

## 2020-09-22 NOTE — Patient Instructions (Signed)
Access Code: [email or web address] URL: [email or web address] Date: 09/22/2020 Prepared by: Eulis Foster  Exercises Supine Hip Adduction Isometric with Newman Pies - 1 x daily - 2 x weekly - 1 sets - 10 reps Sidelying Pelvic Floor Contraction with Hip Abduction - 1 x daily - 2 x weekly - 2 sets - 10 reps Supine Alternating Shoulder Flexion - 1 x daily - 2 x weekly - 2 sets - 10 reps Bird Dog - 1 x daily - 2 x weekly - 2 sets - 10 reps Eulis Foster, PT [facility] [address], [address] [place], [place] [address] W: [phone] [email or web address]

## 2020-10-20 ENCOUNTER — Encounter: Payer: Self-pay | Admitting: *Deleted

## 2020-11-10 ENCOUNTER — Encounter: Payer: Medicaid Other | Admitting: Physical Therapy

## 2020-11-17 ENCOUNTER — Encounter: Payer: Self-pay | Admitting: Physical Therapy

## 2020-11-17 ENCOUNTER — Encounter: Payer: Medicaid Other | Attending: Nurse Practitioner | Admitting: Physical Therapy

## 2020-11-17 ENCOUNTER — Other Ambulatory Visit: Payer: Self-pay

## 2020-11-17 DIAGNOSIS — N393 Stress incontinence (female) (male): Secondary | ICD-10-CM | POA: Insufficient documentation

## 2020-11-17 DIAGNOSIS — M6281 Muscle weakness (generalized): Secondary | ICD-10-CM | POA: Insufficient documentation

## 2020-11-17 DIAGNOSIS — R278 Other lack of coordination: Secondary | ICD-10-CM | POA: Diagnosis present

## 2020-11-17 NOTE — Therapy (Signed)
[place] at [facility] [address] [place], [place], [address] Phone: [phone]   Fax:  [phone]  Physical Therapy Treatment  Patient Details  Name: Jodi Estrada MRN: [id number] Date of Birth: [birth year] Referring Provider (PT): Earlie Server, NP   Encounter Date: 11/17/2020   PT End of Session - 11/17/20 0933    Visit Number 5    Date for PT Re-Evaluation 12/15/20    Authorization Type Medicaid UHC    Authorization - Visit Number 5    Authorization - Number of Visits 27    PT Start Time 0930    PT Stop Time 1015    PT Time Calculation (min) 45 min    Activity Tolerance Patient tolerated treatment well    Behavior During Therapy [facility] for tasks assessed/performed           Past Medical History:  Diagnosis Date  . Abnormal Pap smear    follow up was wnl  . Bacterial vaginosis   . Chlamydia   . Ectopic pregnancy   . Genital herpes   . Trichomonas     Past Surgical History:  Procedure Laterality Date  . DILATION AND CURETTAGE OF UTERUS    . LAPAROSCOPY  01/29/2012   Procedure: LAPAROSCOPY OPERATIVE;  Surgeon: Juliene Pina C. Hulan Fray, MD;  Location: [facility];  Service: Gynecology;  Laterality: N/A;  operative laparoscopy. Right salpingectomy with removal of ectopic pregnancy    There were no vitals filed for this visit.   Subjective Assessment - 11/17/20 0932    Subjective Urinary leakage is 50% better. I am not drinking the tea and it helps. I am contracting the pelvic floor when pushing at work. I am using the band exercises.    Patient Stated Goals Stop the urinary leakage    Currently in Pain? No/denies    Multiple Pain Sites No              [facility] PT Assessment - 11/17/20 0001      Assessment   Medical Diagnosis N39.3 Stress incontinence of urine    Referring Provider (PT) Earlie Server, NP    Onset Date/Surgical Date 09/01/18    Prior Therapy none      Precautions   Precautions None       Restrictions   Weight Bearing Restrictions No      Home Environment   Living Environment Private residence      Prior Function   Level of Independence Independent    Vocation Full time employment    Vocation Requirements mail clerk, standing, 8 hour shift      Cognition   Overall Cognitive Status Within Functional Limits for tasks assessed      PROM   Right Hip Internal Rotation  20    Left Hip Internal Rotation  25      Strength   Right Hip ABduction 5/5    Left Hip ABduction 4+/5      Palpation   SI assessment  ASIS is equal                       Pelvic Floor Special Questions - 11/17/20 0001    Pelvic Floor Internal Exam Patient confirms identifcation and approves PT to assess pelvic floor and treatment    Exam Type Vaginal    Strength fair squeeze, definite lift    Strength # of seconds 5  [place] Adult PT Treatment/Exercise - 11/17/20 0001      Neuro Re-ed    Neuro Re-ed Details  pelvic floor contraction in sidely with tactile cues for the lift      Lumbar Exercises: Supine   Ab Set 20 reps;1 second    AB Set Limitations contracting the abdominals without bulging and ball squeeze    Dead Bug 20 reps;1 second    Dead Bug Limitations with tactile cues to engage the lower abdominal and breath out    Other Supine Lumbar Exercises curl-up with ball squeeze and abdominal bracing 20x      Lumbar Exercises: Sidelying   Hip Abduction Right;10 reps;1 second;Left    Hip Abduction Weights (lbs) with tactile cues      Manual Therapy   Manual Therapy Internal Pelvic Floor    Internal Pelvic Floor manual work to the sides of the introitus, perineal body, and uretrha spincter to elongate the muscles and improve contraction                  PT Education - 11/17/20 1018    Education Details Access Code: [email or web address]    Person(s) Educated Patient    Methods Explanation;Demonstration;Verbal cues;Handout    Comprehension Returned  demonstration;Verbalized understanding            PT Short Term Goals - 09/15/20 1352      PT SHORT TERM GOAL #1   Title independent with intial HEP    Time 4    Period Weeks    Status Achieved             PT Long Term Goals - 11/17/20 9678      PT LONG TERM GOAL #1   Title independent with advanced HEP    Baseline continously learning as she progresses    Time 12    Period Weeks    Status On-going      PT LONG TERM GOAL #2   Title able to cough, sneeze, laugh, yell without urinary leakage due to increased strength >/= 3/5    Baseline strength is 2/5, pushes therapist finger out when she coughs    Time 12    Period Weeks    Status On-going      PT LONG TERM GOAL #3   Title able to walk from parking lot to her apartment without leaking urine due to increased strength and pressure management    Baseline met    Time 12    Period Weeks    Status Achieved      PT LONG TERM GOAL #4   Title able to contract her abdominals with a pelvic floor contraction to improve pelvic floor coordination    Time 12    Period Weeks    Status Achieved                 Plan - 11/17/20 1019    Clinical Impression Statement Patient is wearing less pads. She is able to walk from her car to her apartment without urinary leakage. She reports urinary leakage is 50% better with coughing, sneezinf, laughing and work activities. Her pelvic floor strength has increased to 3/5 with a lift and circular contration. Patient has increased in hip A/ROM for hip internal rotation and increased in bilateral hip abduction strength. Patient will benefit from skllled therapy to reduce urinary leakage while increasing strength.    Personal Factors and Comorbidities Age    Examination-Activity Limitations Continence    Stability/Clinical Decision  Making Stable/Uncomplicated    Rehab Potential Excellent    PT Frequency 1x / week    PT Duration 4 weeks   starting on 11/24/2020   PT  Treatment/Interventions Biofeedback;Therapeutic activities;Therapeutic exercise;Neuromuscular re-education;Patient/family education;Manual techniques;Dry needling    PT Next Visit Plan progress standing exercises and reivew the past ones    PT Home Exercise Plan Access Code: [email or web address]    Recommended Other Services sent MD renewal    Consulted and Agree with Plan of Care Patient           Patient will benefit from skilled therapeutic intervention in order to improve the following deficits and impairments:  Decreased endurance, Decreased activity tolerance, Decreased strength, Increased fascial restricitons, Decreased coordination  Visit Diagnosis: Muscle weakness (generalized) - Plan: PT plan of care cert/re-cert  Other lack of coordination - Plan: PT plan of care cert/re-cert  Stress incontinence (female) (female) - Plan: PT plan of care cert/re-cert     Problem List Patient Active Problem List   Diagnosis Date Noted  . Current every day smoker 06/29/2020  . Presence of 52 mg levonorgestrel-releasing intrauterine device (IUD) 02/11/2016  . History of ectopic pregnancy 04/25/2012  . History of abnormal Pap smear 04/25/2012  . Genital herpes 04/25/2012    Earlie Counts, PT 11/17/20 10:25 AM   [place] at [facility] [address] [place], [place], [address] Phone: [phone]   Fax:  [phone]  Name: Jodi Estrada MRN: [id number] Date of Birth: [birth year]

## 2020-11-17 NOTE — Patient Instructions (Signed)
Access Code: [email or web address] URL: [email or web address] Date: 11/17/2020 Prepared by: Eulis Foster  Exercises Seated Pelvic Floor Contraction - 4 x daily - 7 x weekly - 3 sets - 10 reps - 5 sec hold Deadlift with Resistance - 1 x daily - 2 x weekly - 2 sets - 10 reps Standing Overhead Press with Bar - 1 x daily - 2 x weekly - 2 sets - 10 reps Split Stance Single Arm Chest Press with Resistance - 1 x daily - 2 x weekly - 3 sets - 10 reps Half Kneeling Anti-Rotation Press - Forward Leg Anchor Side - 1 x daily - 2 x weekly - 3 sets - 10 reps Supine Hip Adduction Isometric with Ball - 1 x daily - 2 x weekly - 1 sets - 10 reps Sidelying Pelvic Floor Contraction with Hip Abduction - 1 x daily - 2 x weekly - 2 sets - 10 reps Bird Dog - 1 x daily - 2 x weekly - 2 sets - 10 reps Dead Bug - 1 x daily - 2 x weekly - 2 sets - 10 reps Eulis Foster, PT [facility] [address] [place], [place] [address] W: [phone] [email or web address]

## 2020-12-01 ENCOUNTER — Encounter: Payer: Medicaid Other | Admitting: Physical Therapy

## 2020-12-08 ENCOUNTER — Encounter: Payer: Medicaid Other | Admitting: Physical Therapy

## 2020-12-22 ENCOUNTER — Encounter: Payer: Self-pay | Admitting: Physical Therapy

## 2020-12-22 ENCOUNTER — Other Ambulatory Visit: Payer: Self-pay

## 2020-12-22 ENCOUNTER — Encounter: Payer: Medicaid Other | Attending: Nurse Practitioner | Admitting: Physical Therapy

## 2020-12-22 DIAGNOSIS — M6281 Muscle weakness (generalized): Secondary | ICD-10-CM | POA: Insufficient documentation

## 2020-12-22 DIAGNOSIS — N393 Stress incontinence (female) (male): Secondary | ICD-10-CM | POA: Insufficient documentation

## 2020-12-22 DIAGNOSIS — R278 Other lack of coordination: Secondary | ICD-10-CM | POA: Insufficient documentation

## 2020-12-22 NOTE — Patient Instructions (Signed)
Access Code: [email or web address] URL: [email or web address] Date: 12/22/2020 Prepared by: Eulis Foster  Exercises Seated Pelvic Floor Contraction - 4 x daily - 7 x weekly - 3 sets - 10 reps - 5 sec hold Deadlift with Resistance - 1 x daily - 2 x weekly - 2 sets - 10 reps Split Stance Single Arm Chest Press with Resistance - 1 x daily - 2 x weekly - 3 sets - 10 reps Half Kneeling Anti-Rotation Press - Forward Leg Anchor Side - 1 x daily - 2 x weekly - 3 sets - 10 reps Supine Hip Adduction Isometric with Ball - 1 x daily - 2 x weekly - 1 sets - 10 reps Dead Bug - 1 x daily - 2 x weekly - 2 sets - 10 reps Side Plank on Knees - 1 x daily - 7 x weekly - 1 sets - 10 reps Bird Dog with Knee Taps - 1 x daily - 7 x weekly - 2 sets - 10 reps Eulis Foster, PT [facility] [address] [place], [place] [address] W: [phone] [email or web address]

## 2020-12-22 NOTE — Therapy (Signed)
[place] at [facility] [address], [place], [place], [address] Phone: [phone]   Fax:  [phone]  Physical Therapy Treatment/Discharge  Patient Details  Name: Jodi Estrada MRN: [id number] Date of Birth: 15-Nov-1986 Referring Provider (PT): Earlie Server, NP   Encounter Date: 12/22/2020   PT End of Session - 12/22/20 1446    Visit Number 6    Authorization Type Medicaid UHC    PT Start Time 1400    PT Stop Time 1440    PT Time Calculation (min) 40 min    Activity Tolerance Patient tolerated treatment well    Behavior During Therapy [facility] for tasks assessed/performed           Past Medical History:  Diagnosis Date  . Abnormal Pap smear    follow up was wnl  . Bacterial vaginosis   . Chlamydia   . Ectopic pregnancy   . Genital herpes   . Trichomonas     Past Surgical History:  Procedure Laterality Date  . DILATION AND CURETTAGE OF UTERUS    . LAPAROSCOPY  01/29/2012   Procedure: LAPAROSCOPY OPERATIVE;  Surgeon: Juliene Pina C. Hulan Fray, MD;  Location: [facility];  Service: Gynecology;  Laterality: N/A;  operative laparoscopy. Right salpingectomy with removal of ectopic pregnancy    There were no vitals filed for this visit.   Subjective Assessment - 12/22/20 1406    Subjective I have been practicing my breathing. I have been tightening the pelvic floor. I have worn a pad just in case. They pads phave been dry.    Patient Stated Goals Stop the urinary leakage    Currently in Pain? No/denies    Multiple Pain Sites No              [facility] PT Assessment - 12/22/20 0001      Assessment   Medical Diagnosis N39.3 Stress incontinence of urine    Referring Provider (PT) Earlie Server, NP    Onset Date/Surgical Date 09/01/18    Prior Therapy none      Precautions   Precautions None      Restrictions   Weight Bearing Restrictions No      Home Environment   Living Environment Private residence      Prior Function    Level of Independence Independent    Vocation Full time employment    Vocation Requirements mail clerk, standing, 8 hour shift      Cognition   Overall Cognitive Status Within Functional Limits for tasks assessed      PROM   Right Hip Internal Rotation  20    Left Hip Internal Rotation  25      Strength   Right Hip ABduction 5/5    Left Hip ABduction 5/5      Palpation   SI assessment  ASIS is equal                       Pelvic Floor Special Questions - 12/22/20 0001    Urinary Leakage No    Pad use 1 pad just in case    Strength fair squeeze, definite lift             [facility] Adult PT Treatment/Exercise - 12/22/20 0001      Lumbar Exercises: Standing   Lifting Limitations Dead lift with green band 20x with tactile cues to keep shoulders retracted with 3# weight in each hand    Other Standing Lumbar Exercises  standing pallof with feet apart then split stance 20x each with red band engaging the core and pelvic floor      Lumbar Exercises: Supine   Dead Bug 20 reps;1 second    Dead Bug Limitations one hand pushes 2# whiel the other reist knee flexion      Lumbar Exercises: Sidelying   Other Sidelying Lumbar Exercises side plank 10x each side with core engagement                  PT Education - 12/22/20 1445    Education Details Access Code: [email or web address]    Person(s) Educated Patient    Methods Explanation;Demonstration;Verbal cues;Handout    Comprehension Returned demonstration;Verbalized understanding            PT Short Term Goals - 09/15/20 1352      PT SHORT TERM GOAL #1   Title independent with intial HEP    Time 4    Period Weeks    Status Achieved             PT Long Term Goals - 12/22/20 1411      PT LONG TERM GOAL #1   Title independent with advanced HEP    Baseline continously learning as she progresses    Time 12    Period Weeks    Status Achieved      PT LONG TERM GOAL #2   Title able to cough, sneeze, laugh, yell  without urinary leakage due to increased strength >/= 3/5    Baseline has not happened in several weeks    Time 12    Period Weeks    Status Achieved      PT LONG TERM GOAL #3   Title able to walk from parking lot to her apartment without leaking urine due to increased strength and pressure management    Baseline met    Period Weeks    Status Achieved      PT LONG TERM GOAL #4   Title able to contract her abdominals with a pelvic floor contraction to improve pelvic floor coordination    Time 12    Period Weeks    Status Achieved                 Plan - 12/22/20 1447    Clinical Impression Statement Patient pelvic floor strength is 3/5. Her hip strength is 5/5. Patient is not having urinary leakage. She  will wear a pad just in case. Patient has an advance HEP for her pelvic floor and core. Patient has been consistent with her HEP. Patient understands she is to continue the exercises or there could be a chance of the muscles becoming weaker and cause leakage.    Personal Factors and Comorbidities Age    Examination-Activity Limitations Continence    Stability/Clinical Decision Making Stable/Uncomplicated    Rehab Potential Excellent    PT Frequency One time visit   for the discharge   PT Duration Other (comment)   1 time visit for the discharge   PT Treatment/Interventions Biofeedback;Therapeutic activities;Therapeutic exercise;Neuromuscular re-education;Patient/family education;Manual techniques;Dry needling    PT Next Visit Plan Discharge to HEP    PT Home Exercise Plan Access Code: [email or web address]    Recommended Other Services MD signed all notes    Consulted and Agree with Plan of Care Patient           Patient will benefit from skilled therapeutic intervention in order to improve the following deficits and impairments:  Decreased endurance,Decreased activity tolerance,Decreased strength,Increased fascial restricitons,Decreased coordination  Visit Diagnosis: Muscle weakness  (generalized) - Plan: PT plan of care cert/re-cert  Other lack of coordination - Plan: PT plan of care cert/re-cert  Stress incontinence (female) (female) - Plan: PT plan of care cert/re-cert     Problem List Patient Active Problem List   Diagnosis Date Noted  . Current every day smoker 06/29/2020  . Presence of 52 mg levonorgestrel-releasing intrauterine device (IUD) 02/11/2016  . History of ectopic pregnancy 04/25/2012  . History of abnormal Pap smear 04/25/2012  . Genital herpes 04/25/2012    Earlie Counts, PT 12/22/20 2:56 PM   [place] at [facility] [address], [place], [place], [address] Phone: [phone]   Fax:  [phone]  Name: Jodi Estrada MRN: [id number] Date of Birth: [birth year]  PHYSICAL THERAPY DISCHARGE SUMMARY  Visits from Start of Care: 6  Current functional level related to goals / functional outcomes: See above.    Remaining deficits: See above.    Education / Equipment: HEP Plan: Patient agrees to discharge.  Patient goals were met. Patient is being discharged due to meeting the stated rehab goals. Thank you for the referral. Earlie Counts, PT 12/22/20 2:56 PM   ?????

## 2021-01-12 ENCOUNTER — Encounter: Payer: Self-pay | Admitting: Physical Therapy

## 2021-01-13 ENCOUNTER — Other Ambulatory Visit (HOSPITAL_COMMUNITY)
Admission: RE | Admit: 2021-01-13 | Discharge: 2021-01-13 | Disposition: A | Discharge status: Home / Self Care | Payer: Medicaid Other | Source: Ambulatory Visit | Attending: Family Medicine | Admitting: Family Medicine

## 2021-01-13 ENCOUNTER — Ambulatory Visit (INDEPENDENT_AMBULATORY_CARE_PROVIDER_SITE_OTHER): Payer: Medicaid Other

## 2021-01-13 ENCOUNTER — Other Ambulatory Visit: Payer: Self-pay

## 2021-01-13 VITALS — BP 120/84 | HR 89 | Wt 271.5 lb

## 2021-01-13 DIAGNOSIS — N898 Other specified noninflammatory disorders of vagina: Secondary | ICD-10-CM | POA: Diagnosis not present

## 2021-01-13 NOTE — Progress Notes (Signed)
Pt here today with c/o vaginal irritiation on the outside of the labia with some discharge that she sees when she wipes with washcloth and no odor.  Pt explained how to obtain self swab and that we will call with abnormal results.  Pt verbalized understanding with no further questions.   Addison Naegeli, RN  01/13/21

## 2021-01-14 LAB — CERVICOVAGINAL ANCILLARY ONLY
Bacterial Vaginitis (gardnerella): NEGATIVE
Candida Glabrata: NEGATIVE
Candida Vaginitis: POSITIVE — AB
Chlamydia: NEGATIVE
Comment: NEGATIVE
Comment: NEGATIVE
Comment: NEGATIVE
Comment: NEGATIVE
Comment: NEGATIVE
Comment: NORMAL
Neisseria Gonorrhea: NEGATIVE
Trichomonas: NEGATIVE

## 2021-01-18 ENCOUNTER — Telehealth: Payer: Self-pay | Admitting: Lactation Services

## 2021-01-18 MED ORDER — FLUCONAZOLE 150 MG PO TABS: 150.0000 mg | ORAL_TABLET | ORAL | 0 refills | Status: DC

## 2021-01-18 NOTE — Telephone Encounter (Signed)
Called patient to inform her that her results are + for yeast. She denies pregnancy and would prefer Diflucan for treatment. Prescription sent to patient pharmacy.

## 2021-01-18 NOTE — Telephone Encounter (Signed)
-----   Message from Jerene Bears, MD sent at 01/15/2021  2:27 PM EST ----- Please let pt know her testing showed only yeast.  Ok to treat with terazol 7 nightly x 7 nights or diflucan 150mg  po x 1, repeat 72 hour.s  #2/0RF.  Please confirm she is not pregnant or has risks of pregnancy if desires the oral dosage.  Thank you.

## 2021-01-19 ENCOUNTER — Encounter: Payer: Self-pay | Admitting: Physical Therapy

## 2021-09-28 ENCOUNTER — Other Ambulatory Visit: Payer: Self-pay

## 2021-09-28 ENCOUNTER — Ambulatory Visit: Payer: Medicaid Other

## 2021-09-28 ENCOUNTER — Ambulatory Visit (INDEPENDENT_AMBULATORY_CARE_PROVIDER_SITE_OTHER): Payer: Medicaid Other

## 2021-09-28 ENCOUNTER — Other Ambulatory Visit (HOSPITAL_COMMUNITY)
Admission: RE | Admit: 2021-09-28 | Discharge: 2021-09-28 | Disposition: A | Discharge status: Home / Self Care | Payer: Medicaid Other | Source: Ambulatory Visit | Attending: Family Medicine | Admitting: Family Medicine

## 2021-09-28 VITALS — BP 118/75 | HR 79 | Ht 66.5 in | Wt 282.2 lb

## 2021-09-28 DIAGNOSIS — N898 Other specified noninflammatory disorders of vagina: Secondary | ICD-10-CM | POA: Insufficient documentation

## 2021-09-28 MED ORDER — FLUCONAZOLE 150 MG PO TABS: 150.0000 mg | ORAL_TABLET | Freq: Once | ORAL | 0 refills | Status: AC

## 2021-09-28 NOTE — Progress Notes (Signed)
Patient was evaluated by nursing staff. Agree with assessment and plan.  °

## 2021-09-28 NOTE — Progress Notes (Signed)
Pt here today for STD testing and c/o vaginal itching with irritation x 3 days.  Pt states has white, thick vaginal discharge. Denies odor or abd pain or cramps. Pt states had oral sex recently, but not IC with new partner. Pt asked for condoms. Pt given condoms from office.   Self swab collected today. Pt advised results will take 24-48 hours and will see results in mychart and will be notified if needs further treatment. Pt verbalized understanding.   Pt was treated with Diflucan Rx #1, 0RF per protocol. Pt agreeable to plan of care.   Judeth Cornfield, RN

## 2021-09-29 LAB — CERVICOVAGINAL ANCILLARY ONLY
Bacterial Vaginitis (gardnerella): POSITIVE — AB
Candida Glabrata: NEGATIVE
Candida Vaginitis: POSITIVE — AB
Chlamydia: NEGATIVE
Comment: NEGATIVE
Comment: NEGATIVE
Comment: NEGATIVE
Comment: NEGATIVE
Comment: NEGATIVE
Comment: NORMAL
Neisseria Gonorrhea: NEGATIVE
Trichomonas: NEGATIVE

## 2021-10-01 ENCOUNTER — Telehealth: Payer: Self-pay | Admitting: General Practice

## 2021-10-01 NOTE — Telephone Encounter (Signed)
Patient called back into front office stating she is returning our call. Asked patient if her symptoms improved following the diflucan and she states yes it was much better. Patient reports currently being on her period. Reviewed results of wet prep with her. Discussed if discharge and symptoms improved we can hold off on BV treatment. Advised if after her period she noticed bothersome symptoms she could call us back for treatment. Patient verbalized understanding.

## 2021-10-01 NOTE — Telephone Encounter (Signed)
Called patient, no answer- unable to leave message due to voicemail full. Emergency contact unavailable as well

## 2021-10-01 NOTE — Telephone Encounter (Signed)
-----   Message from Allayne Stack, DO sent at 09/30/2021  5:10 PM EDT ----- Attempted to reach patient. No answer and mailbox full. + for BV and yeast, already treated with diflucan (discharge/sx were most consistent w/ yeast) via RN visit on 10/18. Can we please reach out to the patient to see if her symptoms have improved with this? If not, we can try flagyl as well.   Thank you, Dr Annia Friendly

## 2022-05-18 ENCOUNTER — Ambulatory Visit (INDEPENDENT_AMBULATORY_CARE_PROVIDER_SITE_OTHER): Payer: Medicaid Other | Admitting: Family Medicine

## 2022-05-19 NOTE — Progress Notes (Signed)
Erroneous encounter

## 2023-02-14 ENCOUNTER — Ambulatory Visit: Payer: Federal, State, Local not specified - PPO

## 2023-02-27 ENCOUNTER — Ambulatory Visit: Payer: Federal, State, Local not specified - PPO

## 2023-03-06 ENCOUNTER — Other Ambulatory Visit: Payer: Self-pay

## 2023-03-06 ENCOUNTER — Other Ambulatory Visit (HOSPITAL_COMMUNITY)
Admission: RE | Admit: 2023-03-06 | Discharge: 2023-03-06 | Disposition: A | Discharge status: Home / Self Care | Payer: Federal, State, Local not specified - PPO | Source: Ambulatory Visit | Attending: Family Medicine | Admitting: Family Medicine

## 2023-03-06 ENCOUNTER — Ambulatory Visit (INDEPENDENT_AMBULATORY_CARE_PROVIDER_SITE_OTHER): Payer: Federal, State, Local not specified - PPO

## 2023-03-06 VITALS — BP 113/84 | HR 72 | Ht 64.0 in | Wt 282.4 lb

## 2023-03-06 DIAGNOSIS — N898 Other specified noninflammatory disorders of vagina: Secondary | ICD-10-CM | POA: Insufficient documentation

## 2023-03-06 DIAGNOSIS — Z0000 Encounter for general adult medical examination without abnormal findings: Secondary | ICD-10-CM

## 2023-03-06 NOTE — Progress Notes (Signed)
Jodi Estrada is here with concern of itching in vaginal area, abnormal discharge (white, excessively). These symptoms have been present for 2 weeks. Patient reports she has tried nothing to resolve symptoms.   Plan of care:   Self swab instructions given and specimen obtained. Explained patient will be contacted with any abnormal results. Offered to send in treatment for yeast d/t current symptoms, but patient requested to wait for results before starting treatment. Patient is due for annual exam; offered for patient to schedule during checkout. Patient has appointment scheduled for 03/16/23 at 1535.   Patient requested referral for PCP within [facility] system so that patient can establish care. Amb referral for PCP ordered.   Alinda Money, RN 03/06/2023  8:33 AM

## 2023-03-06 NOTE — Addendum Note (Signed)
Addended by: Forrestine Him A on: 03/06/2023 05:11 PM   Modules accepted: Orders

## 2023-03-08 LAB — CERVICOVAGINAL ANCILLARY ONLY
Bacterial Vaginitis (gardnerella): POSITIVE — AB
Candida Glabrata: NEGATIVE
Candida Vaginitis: NEGATIVE
Chlamydia: NEGATIVE
Comment: NEGATIVE
Comment: NEGATIVE
Comment: NEGATIVE
Comment: NEGATIVE
Comment: NEGATIVE
Comment: NORMAL
Neisseria Gonorrhea: NEGATIVE
Trichomonas: POSITIVE — AB

## 2023-03-09 ENCOUNTER — Other Ambulatory Visit: Payer: Self-pay | Admitting: Obstetrics and Gynecology

## 2023-03-09 DIAGNOSIS — B9689 Other specified bacterial agents as the cause of diseases classified elsewhere: Secondary | ICD-10-CM

## 2023-03-09 DIAGNOSIS — A599 Trichomoniasis, unspecified: Secondary | ICD-10-CM

## 2023-03-09 MED ORDER — METRONIDAZOLE 500 MG PO TABS: 500.0000 mg | ORAL_TABLET | Freq: Two times a day (BID) | ORAL | 0 refills | Status: AC

## 2023-03-13 ENCOUNTER — Telehealth: Payer: Self-pay

## 2023-03-13 NOTE — Telephone Encounter (Addendum)
-----   Message from Darliss Cheney, MD sent at 03/09/2023  7:15 PM EDT ----- Notify pt of trichamonas and BV. Both can be treated with flagyl. Patient should notify sexual partner(s) and they should be treated as well. Abstain from intercourse for 7 days following treatment  Notified pt results and that treatment has been sent to her [place].  Pt advised to abstain from intercourse for at least two weeks after you both have been treated.  Pt verbalized understanding with no further questions.   Frances Nickels

## 2023-03-16 ENCOUNTER — Other Ambulatory Visit: Payer: Self-pay

## 2023-03-16 ENCOUNTER — Ambulatory Visit (INDEPENDENT_AMBULATORY_CARE_PROVIDER_SITE_OTHER): Payer: Federal, State, Local not specified - PPO | Admitting: Obstetrics and Gynecology

## 2023-03-16 ENCOUNTER — Encounter: Payer: Self-pay | Admitting: Obstetrics and Gynecology

## 2023-03-16 VITALS — BP 130/88 | HR 73 | Ht 64.0 in | Wt 275.0 lb

## 2023-03-16 DIAGNOSIS — Z01419 Encounter for gynecological examination (general) (routine) without abnormal findings: Secondary | ICD-10-CM | POA: Diagnosis not present

## 2023-03-16 NOTE — Progress Notes (Signed)
ANNUAL EXAM Patient name: Jodi Estrada MRN [id number]  Date of birth: 11-05-1986 Chief Complaint:   No chief complaint on file.  History of Present Illness:   Jodi Estrada is a 37 y.o. [phone] being seen today for a routine annual exam.  Current complaints: annual  Menstrual concerns? No   Breast or nipple changes? No  Contraception use? Yes  Sexually active? Yes   Concerned about trichomonas diagnosis recently and prior diagnosis of chlamydia despite being with one partner during this time. Concerned about impact of STI diagnosis in the future.   Patient's last menstrual period was 02/24/2023 (exact date).   The pregnancy intention screening data noted above was reviewed. Potential methods of contraception were discussed. The patient elected to proceed with No data recorded.   Last pap     Component Value Date/Time   DIAGPAP  06/29/2020 1327    - Negative for intraepithelial lesion or malignancy (NILM)   HPVHIGH Negative 06/29/2020 1327   ADEQPAP  06/29/2020 1327    Satisfactory for evaluation; transformation zone component PRESENT.   Last mammogram: n/a Last colonoscopy: n/a     01/13/2021    1:55 PM 06/29/2020    1:27 PM  Depression screen PHQ 2/9  Decreased Interest 0 0  Down, Depressed, Hopeless 0 0  PHQ - 2 Score 0 0  Altered sleeping 0 1  Tired, decreased energy 0 1  Change in appetite 0 0  Feeling bad or failure about yourself  0 0  Trouble concentrating 1 0  Moving slowly or fidgety/restless 0 0  Suicidal thoughts 0 0  PHQ-9 Score 1 2        01/13/2021    1:55 PM 06/29/2020    1:27 PM  GAD 7 : Generalized Anxiety Score  Nervous, Anxious, on Edge 0 0  Control/stop worrying 0 0  Worry too much - different things 0 0  Trouble relaxing 0 1  Restless 0 0  Easily annoyed or irritable 0 1  Afraid - awful might happen 0 0  Total GAD 7 Score 0 2     Review of Systems:   Pertinent items are noted in HPI Denies any headaches, blurred vision,  fatigue, shortness of breath, chest pain, abdominal pain, abnormal vaginal discharge/itching/odor/irritation, problems with periods, bowel movements, urination, or intercourse unless otherwise stated above. Pertinent History Reviewed:  Reviewed past medical,surgical, social and family history.  Reviewed problem list, medications and allergies. Physical Assessment:   Vitals:   03/16/23 1559  BP: 130/88  Pulse: 73  Weight: 275 lb (124.7 kg)  Height: 5\' 4"  (1.626 m)  There is no height or weight on file to calculate BMI.        Physical Examination:   General appearance - well appearing, and in no distress  Mental status - alert, oriented to person, place, and time  Psych:  She has a normal mood and affect  Skin - warm and dry, normal color, no suspicious lesions noted  Chest - effort normal, all lung fields clear to auscultation bilaterally  Heart - normal rate and regular rhythm  Breasts - breasts appear normal, no suspicious masses, no skin or nipple changes or  axillary nodes  Abdomen - soft, nontender, nondistended, no masses or organomegaly  Extremities:  No swelling or varicosities noted  Chaperone present for exam  No results found for this or any previous visit (from the past 24 hour(s)).    Assessment & Plan:   1. Well woman  exam - Cervical cancer screening: Discussed guidelines. Pap with HPV: due July at earliest - Gardasil: completed - GC/CT/Trich: positive for trich late March - can do TOC in 1 month  - Birth Control: IUD in place - Breast Health: Encouraged self breast awareness/SBE. Teaching provided.  - F/U 12 months and prn   Lorriane Shire, MD 03/16/2023 3:55 PM

## 2023-09-11 ENCOUNTER — Telehealth: Payer: Self-pay | Admitting: *Deleted

## 2023-09-11 NOTE — Telephone Encounter (Signed)
Pt left VM message stating that she is calling about her birth control and requests a call back. I called pt and discussed her concern.  She stated that she does not think she had a period since August and is concerned that her IUD is still effective. She reports that she gets monthly periods but is not always consistent with documenting when they occur. Per chart review, Mirena IUD was placed on 02/11/16. I advised pt that the IUD is effective for pregnancy prevention for 8 years. For her peace of mind, I recommended that she perform home UPT and if negative as I expect, she can relax. I encouraged her to document the first Jodi Estrada of each period. She voiced understanding.

## 2023-11-24 ENCOUNTER — Ambulatory Visit: Payer: Federal, State, Local not specified - PPO | Admitting: Family Medicine

## 2023-12-22 ENCOUNTER — Ambulatory Visit: Payer: Federal, State, Local not specified - PPO | Admitting: Family Medicine

## 2024-01-19 ENCOUNTER — Ambulatory Visit: Payer: BC Managed Care – PPO | Admitting: Family Medicine

## 2024-01-19 ENCOUNTER — Encounter: Payer: Self-pay | Admitting: Family Medicine

## 2024-01-19 DIAGNOSIS — Z7689 Persons encountering health services in other specified circumstances: Secondary | ICD-10-CM

## 2024-01-19 DIAGNOSIS — Z833 Family history of diabetes mellitus: Secondary | ICD-10-CM

## 2024-01-19 DIAGNOSIS — Z83438 Family history of other disorder of lipoprotein metabolism and other lipidemia: Secondary | ICD-10-CM | POA: Diagnosis not present

## 2024-01-19 NOTE — Progress Notes (Signed)
 New Patient Office Visit  Subjective    Patient ID: Jodi Estrada, female    DOB: 18-Oct-1986  Age: 38 y.o. MRN: [id number]  CC:  Chief Complaint  Patient presents with   Establish Care    No PCP in a long time, has been going to OBGYN. No concerns    HPI LIANY MUMPOWER presents to establish care No PCP in the past   Sees OB/GYN   Reports having family history of diabetes and high cholesterol.   Smokes - since age 44 Only 2 cigarettes per day  Alcohol occasionally Marijuana weekly    Single. 3 kids.  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   No facility-administered encounter medications on file as of 01/19/2024.    Past Medical History:  Diagnosis Date   Abnormal Pap smear    follow up was wnl   Bacterial vaginosis    Chlamydia    Ectopic pregnancy    Genital herpes    Trichomonas     Past Surgical History:  Procedure Laterality Date   DILATION AND CURETTAGE OF UTERUS     LAPAROSCOPY  01/29/2012   Procedure: LAPAROSCOPY OPERATIVE;  Surgeon: Harland C. Starla, MD;  Location: [facility];  Service: Gynecology;  Laterality: N/A;  operative laparoscopy. Right salpingectomy with removal of ectopic pregnancy    Family History  Problem Relation Age of Onset   Diabetes Mother    Migraines Sister    Hypertension Maternal Grandmother    Diabetes Maternal Grandmother    Anesthesia problems Neg Hx    Other Neg Hx     Social History   Socioeconomic History   Marital status: Single    Spouse name: Not on file   Number of children: Not on file   Years of education: Not on file   Highest education level: Not on file  Occupational History   Not on file  Tobacco Use   Smoking status: Every Day    Current packs/day: 0.25    Average packs/day: 0.3 packs/day for 5.0 years (1.3 ttl pk-yrs)    Types: Cigarettes   Smokeless  tobacco: Never  Vaping Use   Vaping status: Never Used  Substance and Sexual Activity   Alcohol use: Yes    Comment: occasionally- social   Drug use: Yes    Types: Marijuana    Comment: once in a while per patient   Sexual activity: Yes    Birth control/protection: I.U.D.  Other Topics Concern   Not on file  Social History Narrative   Not on file   Social Drivers of Health   Financial Resource Strain: Not on file  Food Insecurity: Food Insecurity Present (06/29/2020)   Hunger Vital Sign    Worried About Running Out of Food in the Last Year: Sometimes true    Ran Out of Food in the Last Year: Sometimes true  Transportation Needs: Unmet Transportation Needs (06/29/2020)   PRAPARE - Administrator, Civil Service (Medical): Yes    Lack of Transportation (Non-Medical): Yes  Physical Activity: Not on file  Stress: Not on file  Social Connections: Not on file  Intimate Partner Violence: Not on file    Review of Systems  Constitutional:  Negative for chills, fever and malaise/fatigue.  Respiratory:  Negative for shortness of breath.   Cardiovascular:  Negative  for chest pain, palpitations and leg swelling.  Gastrointestinal:  Negative for abdominal pain, constipation, diarrhea, nausea and vomiting.  Genitourinary:  Negative for dysuria, frequency and urgency.  Musculoskeletal:  Negative for joint pain and myalgias.  Neurological:  Negative for dizziness and focal weakness.  Psychiatric/Behavioral:  Negative for depression and substance abuse. The patient is not nervous/anxious.      Objective    BP 130/82 (BP Location: Left Arm, Patient Position: Sitting, Cuff Size: Large)   Pulse 82   Temp 97.6 F (36.4 C) (Temporal)   Ht 5' 4 (1.626 m)   Wt 286 lb (129.7 kg)   SpO2 (!) 82%   BMI 49.09 kg/m   Physical Exam Constitutional:      General: She is not in acute distress.    Appearance: She is not ill-appearing.  Eyes:     Extraocular Movements: Extraocular  movements intact.     Conjunctiva/sclera: Conjunctivae normal.  Cardiovascular:     Rate and Rhythm: Normal rate.  Pulmonary:     Effort: Pulmonary effort is normal.  Musculoskeletal:     Cervical back: Normal range of motion and neck supple.  Skin:    General: Skin is warm and dry.  Neurological:     General: No focal deficit present.     Mental Status: She is alert and oriented to person, place, and time.  Psychiatric:        Mood and Affect: Mood normal.        Behavior: Behavior normal.        Thought Content: Thought content normal.         Assessment & Plan:   Problem List Items Addressed This Visit   None Visit Diagnoses       Severe obesity (BMI >= 40) (HCC)    -  Primary     Family history of diabetes mellitus (DM)         Family history of hyperlipidemia         Encounter to establish care          She is a pleasant 38 year old female who is new to the practice and here to establish care.  She sees her OB/GYN regularly.  No PCP in years.  Feels in her usual state of health. She is not fasting.  Encouraged her to stop smoking.  Discussed potential complications related to obesity including diabetes and hyperlipidemia. She will follow-up for a fasting CPE  Return for fasting CPE at their convenience.   Boby Mackintosh, NP-C

## 2024-01-19 NOTE — Patient Instructions (Signed)
 It was nice to meet you today.  Thank you for trusting us  with your health care.  Please schedule a fasting complete physical exam

## 2024-03-21 ENCOUNTER — Encounter: Payer: BC Managed Care – PPO | Admitting: Family Medicine

## 2024-03-28 ENCOUNTER — Ambulatory Visit: Admitting: Family Medicine

## 2024-09-09 ENCOUNTER — Ambulatory Visit

## 2024-09-26 ENCOUNTER — Ambulatory Visit (INDEPENDENT_AMBULATORY_CARE_PROVIDER_SITE_OTHER): Admitting: *Deleted

## 2024-09-26 ENCOUNTER — Other Ambulatory Visit (HOSPITAL_COMMUNITY)
Admission: RE | Admit: 2024-09-26 | Discharge: 2024-09-26 | Disposition: A | Discharge status: Home / Self Care | Source: Ambulatory Visit | Attending: Family Medicine | Admitting: Family Medicine

## 2024-09-26 VITALS — BP 111/74 | HR 75 | Ht 66.0 in | Wt 285.1 lb

## 2024-09-26 DIAGNOSIS — N898 Other specified noninflammatory disorders of vagina: Secondary | ICD-10-CM | POA: Diagnosis not present

## 2024-09-26 LAB — CERVICOVAGINAL ANCILLARY ONLY
Bacterial Vaginitis (gardnerella): NEGATIVE
Candida Glabrata: NEGATIVE
Candida Vaginitis: POSITIVE — AB
Chlamydia: NEGATIVE
Comment: NEGATIVE
Comment: NEGATIVE
Comment: NEGATIVE
Comment: NEGATIVE
Comment: NEGATIVE
Comment: NORMAL
Neisseria Gonorrhea: NEGATIVE
Trichomonas: NEGATIVE

## 2024-09-26 NOTE — Progress Notes (Signed)
 Patient here for self swab for c/o vaginal itching that is more on her labia. Reports  a little whitish vaginal discharge . Wants full self swab just to make sure no infections or std's. Self swab obtained. Explained if positive and needs treatment she will be contacted. Also advised may use OTC monistat for labial itching. She voices understanding. Rock Skip PEAK

## 2024-09-27 ENCOUNTER — Ambulatory Visit: Payer: Self-pay | Admitting: Obstetrics and Gynecology

## 2024-09-27 DIAGNOSIS — B3731 Acute candidiasis of vulva and vagina: Secondary | ICD-10-CM

## 2024-09-27 MED ORDER — FLUCONAZOLE 150 MG PO TABS: 150.0000 mg | ORAL_TABLET | Freq: Once | ORAL | 1 refills | Status: AC

## 2024-09-30 NOTE — Telephone Encounter (Signed)
-----   Message from Carter Quarry sent at 09/27/2024  1:36 PM EDT ----- Notify of yeast infection, diflucan  sent  ----- Message ----- From: Interface, Lab In [facility] Sent: 09/26/2024   7:30 PM EDT To: Carter Quarry, MD

## 2024-09-30 NOTE — Telephone Encounter (Signed)
 Called patient and left a message we are calling with non-urgent results, please call office back. Rock Skip PEAK

## 2024-10-01 NOTE — Telephone Encounter (Addendum)
 RN called pt to inform her of results and Diflucan  instructions per Dr. Jeralyn. Pt verbalized understanding and had no further questions at this time.    Waddell, RN   ----- Message ----- From: Jeralyn Crutch, MD Sent: 09/27/2024   1:36 PM EDT To: [facility]  Notify of yeast infection, diflucan  sent  ----- Message ----- From: Interface, Lab In [facility] Sent: 09/26/2024   7:30 PM EDT To: Crutch Jeralyn, MD

## 2024-11-09 NOTE — Progress Notes (Signed)
 Subjective:     Jodi Estrada is a 38 y.o. female here at [facility] *** for a routine exam.    being seen today for removal of a {IUD:19197::Liletta ,Mirena ,Paragard,Skyla ,Kyleena }  IUD and insertion of a {IUD:19197::Liletta ,Mirena ,Paragard,Skyla ,Kyleena }  IUD. Her IUD was placed ***.  Current complaints: ***.  Personal and family health history reviewed: {yes/no:9010}.  Do you have a primary care provider? *** Do you feel safe at home? ***  Flowsheet Row Office Visit from 01/19/2024 in [facility]  PHQ-2 Total Score 0    Health Maintenance Due  Topic Date Due   Hepatitis C Screening  Never done   DTaP/Tdap/Td (1 - Tdap) Never done   Pneumococcal Vaccine (1 of 2 - PCV) Never done   Hepatitis B Vaccines 19-59 Average Risk (1 of 3 - 19+ 3-dose series) Never done   HPV VACCINES (2 - 3-dose series) 05/23/2012   Influenza Vaccine  Never done   COVID-19 Vaccine (1 - 2025-26 season) Never done     Risk factors for chronic health problems: Smoking: Alchohol/how much: Pt BMI: There is no height or weight on file to calculate BMI.   Gynecologic History No LMP recorded. (Menstrual status: IUD). Contraception: {method:5051} Last Pap: ***. Results were: {norm/abn:16337} Last mammogram: ***. Results were: {norm/abn:16337}  Obstetric History OB History  Gravida Para Term Preterm AB Living  6 3 3  0 3 3  SAB IAB Ectopic Multiple Live Births  0 2 1 0 3    # Outcome Date GA Lbr Len/2nd Weight Sex Type Anes PTL Lv  6 Term 05/11/08    M Vag-Spont  N LIV  5 Term 01/09/06    M Vag-Spont  N LIV  4 Term 05/26/04    F Vag-Spont  N LIV     Birth Comments: low birth wt  3 IAB           2 Ectopic           1 IAB              {Common ambulatory SmartLinks:19316}  Review of Systems {ros; complete:30496}    Objective:   There were no vitals filed for this visit. There is no height or weight on file to calculate BMI.  VS reviewed,  nursing note reviewed,  Constitutional: well developed, well nourished, no distress HEENT: normocephalic, thyroid without enlargement or mass HEART: RRR, no murmurs rubs/gallops RESP: clear and equal to auscultation bilaterally in all lobes  Breast Exam:  ***Deferred with low risks and shared decision making, discussed recommendation to start mammogram between 40-50 yo/ exam performed: right breast normal without mass, skin or nipple changes or axillary nodes, left breast normal without mass, skin or nipple changes or axillary nodes Abdomen: soft Neuro: alert and oriented x 3 Skin: warm, dry Psych: affect normal Pelvic exam: ***Deferred/ Performed: Cervix pink, visually closed, without lesion, scant white creamy discharge, vaginal walls and external genitalia normal Bimanual exam: Cervix 0/long/high, firm, anterior, neg CMT, uterus nontender, nonenlarged, adnexa without tenderness, enlargement, or mass        Assessment/Plan:   1. Well woman exam (Primary) ***  2. Screening for cervical cancer ***  3. Encounter for removal and reinsertion of intrauterine contraceptive device (IUD) *** No LMP recorded. (Menstrual status: IUD). Last sexual intercourse was *** Last pap***. Results were:  {Desc; normal/abnormal w/wildcard:19060}  The risks and benefits of the method and placement have been thouroughly reviewed with the patient and all questions  were answered.  Specifically the patient is aware of failure rate of 12/998, expulsion of the IUD and of possible perforation.  The patient is aware of irregular bleeding due to the method and understands the incidence of irregular bleeding diminishes with time.  Signed copy of informed consent in chart.  Pertinent History Reviewed:   Reviewed past medical,surgical, social, obstetrical and family history.  Reviewed problem list, medications and allergies. Objective Findings & Procedure:   There were no vitals filed for this visit.There is no  height or weight on file to calculate BMI.  No results found for this or any previous visit (from the past 24 hours).   Time out was performed.  A *** speculum was placed in the vagina.  The cervix was visualized, prepped using Betadine. The strings *** visible. They were grasped and the {IUD:19197::Liletta ,Mirena ,Paragard,Skyla ,Kyleena } IUD was easily removed. The cervix was then grasped with a single-tooth tenaculum. The uterus was found to be {Uterine position:16454} and it sounded to {NUMBERS 0-12:18577} cm.  {IUD:19197::Liletta ,Mirena ,Paragard,Skyla ,Kyleena }  IUD placed per manufacturer's recommendations without complications. The strings were trimmed to approximately 3 cm.  The patient tolerated the procedure well.   Informal transvaginal sonogram was performed and the proper placement of the IUD was verified.  Assessment & Plan:   1) {IUD:19197::Liletta ,Mirena ,Paragard,Skyla ,Kyleena } IUD removal & {IUD:19197::Liletta ,Mirena ,Paragard,Skyla ,Kyleena } insertion The patient was given post procedure instructions, including signs and symptoms of infection and to check for the strings after each menses or each month, and refraining from intercourse or anything in the vagina for 3 days. She was given a care card with date IUD placed, and date IUD to be removed. She is scheduled for a f/u appointment in 4 weeks.  No orders of the defined types were placed in this encounter.   No follow-ups on file.   Camie DELENA Rote, CNM 3:34 PM

## 2024-11-12 ENCOUNTER — Ambulatory Visit: Admitting: Certified Nurse Midwife

## 2024-11-12 DIAGNOSIS — Z30433 Encounter for removal and reinsertion of intrauterine contraceptive device: Secondary | ICD-10-CM

## 2024-11-12 DIAGNOSIS — Z01419 Encounter for gynecological examination (general) (routine) without abnormal findings: Secondary | ICD-10-CM

## 2024-11-12 DIAGNOSIS — Z124 Encounter for screening for malignant neoplasm of cervix: Secondary | ICD-10-CM

## 2024-12-30 NOTE — Progress Notes (Unsigned)
" ° °  Subjective:     MIDORI DADO is a 39 y.o. female here at [facility] *** for a routine exam.  Current complaints: ***.  Personal and family health history reviewed: {yes/no:9010}.  Do you have a primary care provider? *** Do you feel safe at home? ***  Flowsheet Row Office Visit from 01/19/2024 in [facility]  PHQ-2 Total Score 0    Health Maintenance Due  Topic Date Due   Hepatitis C Screening  Never done   DTaP/Tdap/Td (1 - Tdap) Never done   Pneumococcal Vaccine (1 of 2 - PCV) Never done   Hepatitis B Vaccines 19-59 Average Risk (1 of 3 - 19+ 3-dose series) Never done   HPV VACCINES (2 - 3-dose series) 05/23/2012   Influenza Vaccine  Never done   COVID-19 Vaccine (1 - 2025-26 season) Never done     Risk factors for chronic health problems: Smoking: Alchohol/how much: Pt BMI: There is no height or weight on file to calculate BMI.   Gynecologic History No LMP recorded. (Menstrual status: IUD). Contraception: {method:5051} Last Pap: ***. Results were: {norm/abn:16337} Last mammogram: ***. Results were: {norm/abn:16337}  Obstetric History OB History  Gravida Para Term Preterm AB Living  6 3 3  0 3 3  SAB IAB Ectopic Multiple Live Births  0 2 1 0 3    # Outcome Date GA Lbr Len/2nd Weight Sex Type Anes PTL Lv  6 Term 05/11/08    M Vag-Spont  N LIV  5 Term 01/09/06    M Vag-Spont  N LIV  4 Term 05/26/04    F Vag-Spont  N LIV     Birth Comments: low birth wt  3 IAB           2 Ectopic           1 IAB              {Common ambulatory SmartLinks:19316}  Review of Systems {ros; complete:30496}    Objective:   There were no vitals filed for this visit. There is no height or weight on file to calculate BMI.  VS reviewed, nursing note reviewed,  Constitutional: well developed, well nourished, no distress HEENT: normocephalic, thyroid without enlargement or mass HEART: RRR, no murmurs rubs/gallops RESP: clear and equal to auscultation  bilaterally in all lobes  Breast Exam:  ***Deferred with low risks and shared decision making, discussed recommendation to start mammogram between 40-50 yo/ exam performed: right breast normal without mass, skin or nipple changes or axillary nodes, left breast normal without mass, skin or nipple changes or axillary nodes Abdomen: soft Neuro: alert and oriented x 3 Skin: warm, dry Psych: affect normal Pelvic exam: ***Deferred/ Performed: Cervix pink, visually closed, without lesion, scant white creamy discharge, vaginal walls and external genitalia normal Bimanual exam: Cervix 0/long/high, firm, anterior, neg CMT, uterus nontender, nonenlarged, adnexa without tenderness, enlargement, or mass        Assessment/Plan:   1. Well woman exam (Primary) ***  2. Screening for cervical cancer ***  3. General counseling and advice on female contraception ***      No follow-ups on file.   Camie DELENA Rote, CNM 4:13 PM   "

## 2024-12-31 ENCOUNTER — Other Ambulatory Visit: Payer: Self-pay

## 2024-12-31 ENCOUNTER — Other Ambulatory Visit (HOSPITAL_COMMUNITY)
Admission: RE | Admit: 2024-12-31 | Discharge: 2024-12-31 | Disposition: A | Source: Ambulatory Visit | Attending: Certified Nurse Midwife | Admitting: Certified Nurse Midwife

## 2024-12-31 ENCOUNTER — Ambulatory Visit: Admitting: Certified Nurse Midwife

## 2024-12-31 ENCOUNTER — Encounter: Payer: Self-pay | Admitting: Certified Nurse Midwife

## 2024-12-31 VITALS — BP 119/88 | HR 96 | Wt 293.4 lb

## 2024-12-31 DIAGNOSIS — Z124 Encounter for screening for malignant neoplasm of cervix: Secondary | ICD-10-CM | POA: Diagnosis present

## 2024-12-31 DIAGNOSIS — Z01419 Encounter for gynecological examination (general) (routine) without abnormal findings: Secondary | ICD-10-CM

## 2024-12-31 DIAGNOSIS — Z3009 Encounter for other general counseling and advice on contraception: Secondary | ICD-10-CM

## 2024-12-31 NOTE — Progress Notes (Signed)
 "  Subjective:     Jodi Estrada is a 39 y.o. female here at [facility] for a routine exam.  Current complaints: none.  Personal and family health history reviewed: yes.  Do you have a primary care provider? Yes, [facility]  Do you feel safe at home? Yes  Flowsheet Row Office Visit from 12/31/2024 in [facility] [employer]  PHQ-2 Total Score 0    Health Maintenance Due  Topic Date Due   Hepatitis C Screening  Never done   DTaP/Tdap/Td (1 - Tdap) Never done   Pneumococcal Vaccine (1 of 2 - PCV) Never done   Hepatitis B Vaccines 19-59 Average Risk (1 of 3 - 19+ 3-dose series) Never done   HPV VACCINES (2 - 3-dose series) 05/23/2012   Influenza Vaccine  Never done   COVID-19 Vaccine (1 - 2025-26 season) Never done     Risk factors for chronic health problems: Smoking: Alchohol/how much: Pt BMI: Body mass index is 47.36 kg/m.   Gynecologic History Patient's last menstrual period was 12/13/2024 (exact date). Contraception: IUD Liletta , placed in 2017 Last Pap: 06/2020. Results were: normal Last mammogram: N/A, age  Obstetric History OB History  Gravida Para Term Preterm AB Living  6 3 3  0 3 3  SAB IAB Ectopic Multiple Live Births  0 2 1 0 3    # Outcome Date GA Lbr Len/2nd Weight Sex Type Anes PTL Lv  6 Term 05/11/08    M Vag-Spont  N LIV  5 Term 01/09/06    M Vag-Spont  N LIV  4 Term 05/26/04    F Vag-Spont  N LIV     Birth Comments: low birth wt  3 IAB           2 Ectopic           1 IAB             The following portions of the patient's history were reviewed and updated as appropriate: allergies, current medications, past family history, past medical history, past social history, past surgical history, and problem list.  Review of Systems Pertinent items are noted in HPI.    Objective:   Today's Vitals   12/31/24 1344 12/31/24 1402  BP: (!) 120/91 119/88  Pulse: 72 96  Weight: 293 lb 6.4 oz (133.1 kg)    Body  mass index is 47.36 kg/m.  VS reviewed, nursing note reviewed,  Constitutional: well developed, well nourished, no distress HEENT: normocephalic, thyroid without enlargement or mass HEART: RRR, no murmurs rubs/gallops RESP: clear and equal to auscultation bilaterally in all lobes  Breast Exam: Right breast normal without mass, skin or nipple changes or axillary nodes, left breast normal without mass, skin or nipple changes or axillary nodes Abdomen: soft Neuro: alert and oriented x 3 Skin: warm, dry Psych: affect normal Pelvic exam: Performed: Cervix pink, visually closed, without lesion, scant white creamy discharge, vaginal walls and external genitalia normal  Assessment/Plan:   1. Well woman exam (Primary) -Breast exam c/w fibrocystic changes.  -Encouraged self breast exams and breast awareness.  -Counseled to begin mammogram screening at age 21.  2. Screening for cervical cancer -Pap w/ HPV performed today. Continue Q5 year screening at this time.   3. General counseling and advice on female contraception -Discussed IUD removal and replacement today. Patient deferred today and prefers to schedule separate appointment for IUD. Encouraged patient to schedule during her next menses.  No follow-ups on file.   Franchot Budge, Student-PA 3:33 PM   "

## 2025-01-02 ENCOUNTER — Ambulatory Visit: Payer: Self-pay | Admitting: Certified Nurse Midwife

## 2025-01-02 LAB — CYTOLOGY - PAP
Comment: NEGATIVE
Diagnosis: NEGATIVE
High risk HPV: NEGATIVE
